# Patient Record
Sex: Female | Born: 1965 | Race: White | Hispanic: No | Marital: Married | Smoking: Never smoker
Health system: Southern US, Community
[De-identification: ages and names within clinical notes are randomized; demographics above are authoritative.]

## PROBLEM LIST (undated history)

## (undated) DIAGNOSIS — M069 Rheumatoid arthritis, unspecified: Secondary | ICD-10-CM

## (undated) DIAGNOSIS — E079 Disorder of thyroid, unspecified: Secondary | ICD-10-CM

## (undated) HISTORY — DX: Rheumatoid arthritis, unspecified: M06.9

## (undated) HISTORY — PX: VAGINAL PROLAPSE REPAIR: SHX830

## (undated) HISTORY — PX: FRACTURE SURGERY: SHX138

## (undated) HISTORY — PX: ABDOMINAL HYSTERECTOMY: SHX81

---

## 1996-10-27 HISTORY — PX: REDUCTION MAMMAPLASTY: SUR839

## 1998-05-29 ENCOUNTER — Inpatient Hospital Stay (HOSPITAL_COMMUNITY): Admission: AD | Admit: 1998-05-29 | Discharge: 1998-06-06 | Payer: Self-pay | Admitting: *Deleted

## 2004-12-05 ENCOUNTER — Ambulatory Visit: Payer: Self-pay | Admitting: Unknown Physician Specialty

## 2007-09-09 ENCOUNTER — Ambulatory Visit: Payer: Self-pay | Admitting: Internal Medicine

## 2007-09-16 ENCOUNTER — Ambulatory Visit: Payer: Self-pay | Admitting: Internal Medicine

## 2007-09-30 ENCOUNTER — Ambulatory Visit: Payer: Self-pay | Admitting: Internal Medicine

## 2008-01-25 ENCOUNTER — Ambulatory Visit: Payer: Self-pay | Admitting: Family Medicine

## 2008-05-15 ENCOUNTER — Ambulatory Visit: Payer: Self-pay | Admitting: Internal Medicine

## 2009-02-13 ENCOUNTER — Ambulatory Visit: Payer: Self-pay | Admitting: Unknown Physician Specialty

## 2009-03-31 IMAGING — CR CERVICAL SPINE - COMPLETE 4+ VIEW
1 series · 6 of 6 positions shown · non-contrast
Comparison: none

REASON FOR EXAM: neck pain
COMMENTS:

[Series 1: view not recorded · 0.17mm/px · 6 of 6 slices shown]
[im 1/6]
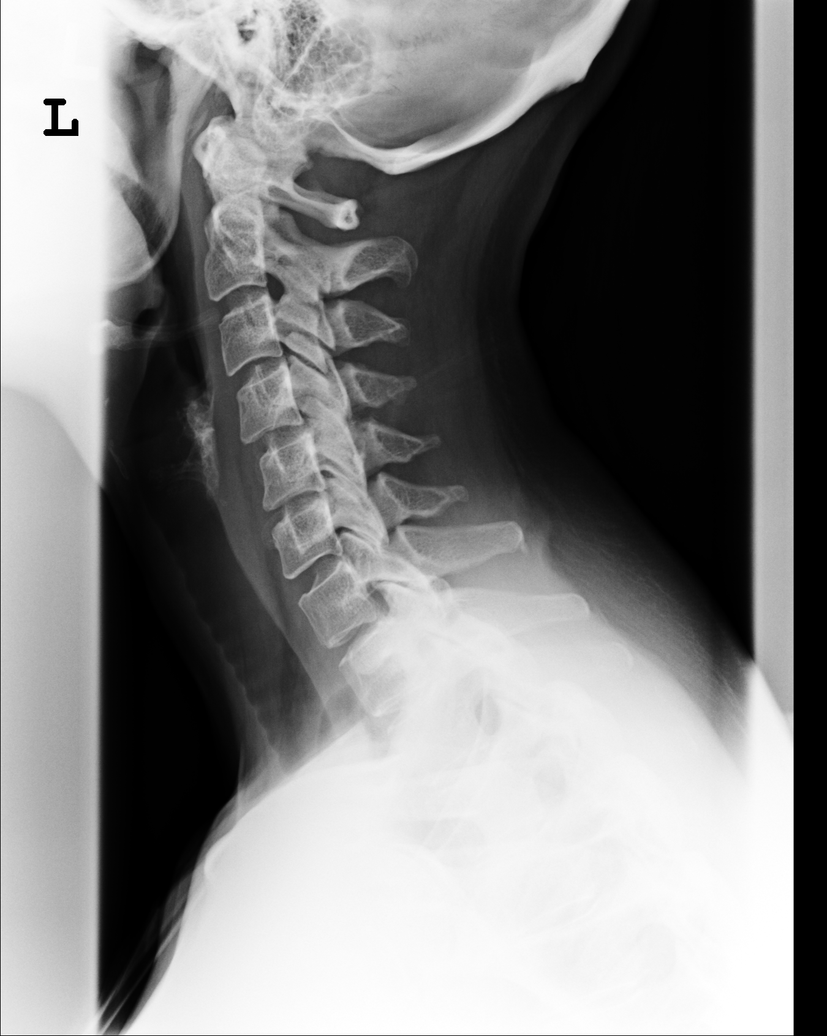
[im 2/6]
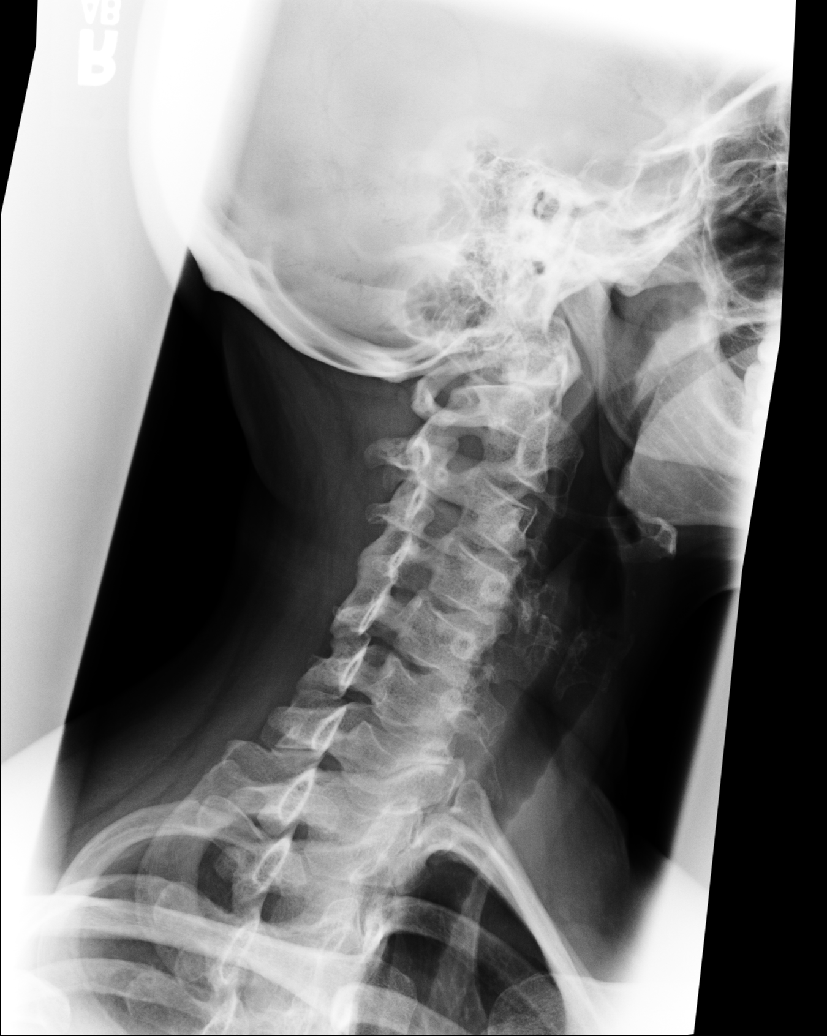
[im 3/6]
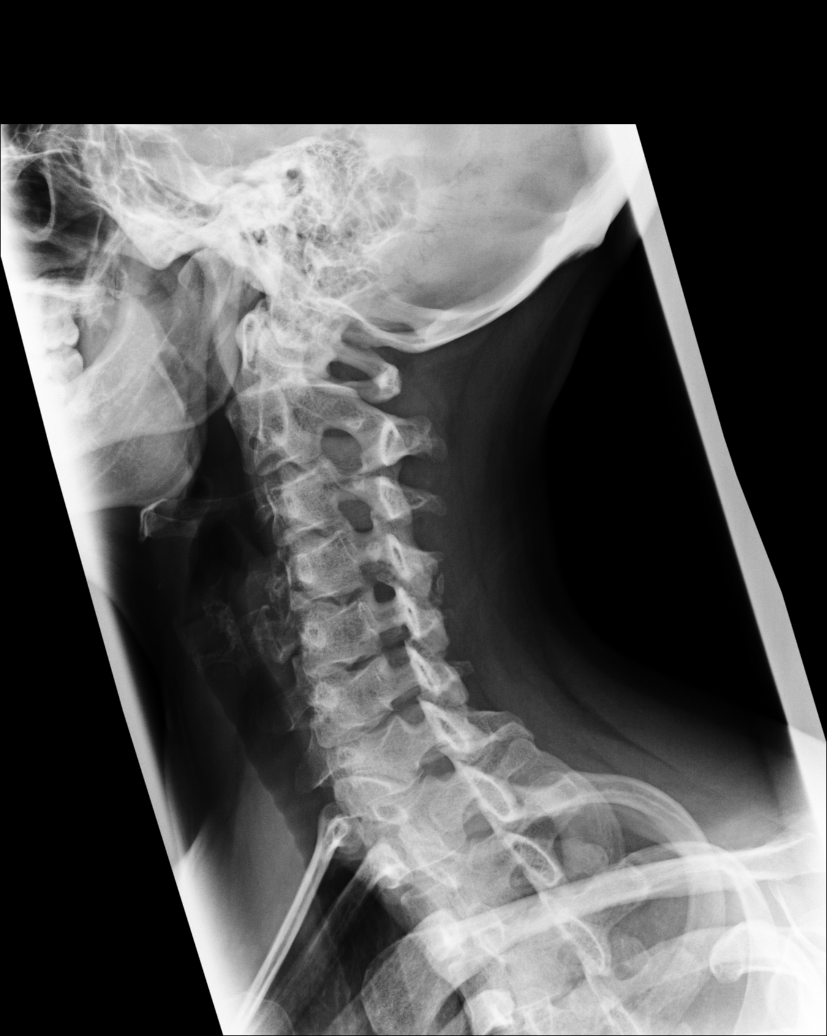
[im 4/6]
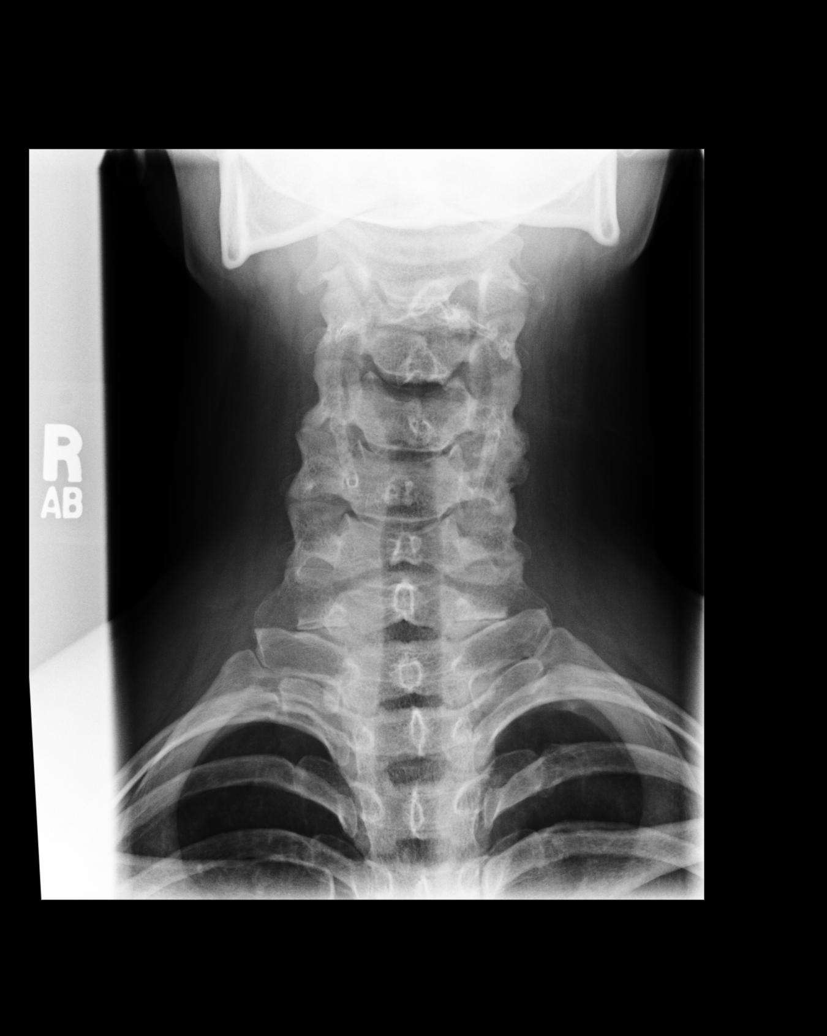
[im 5/6]
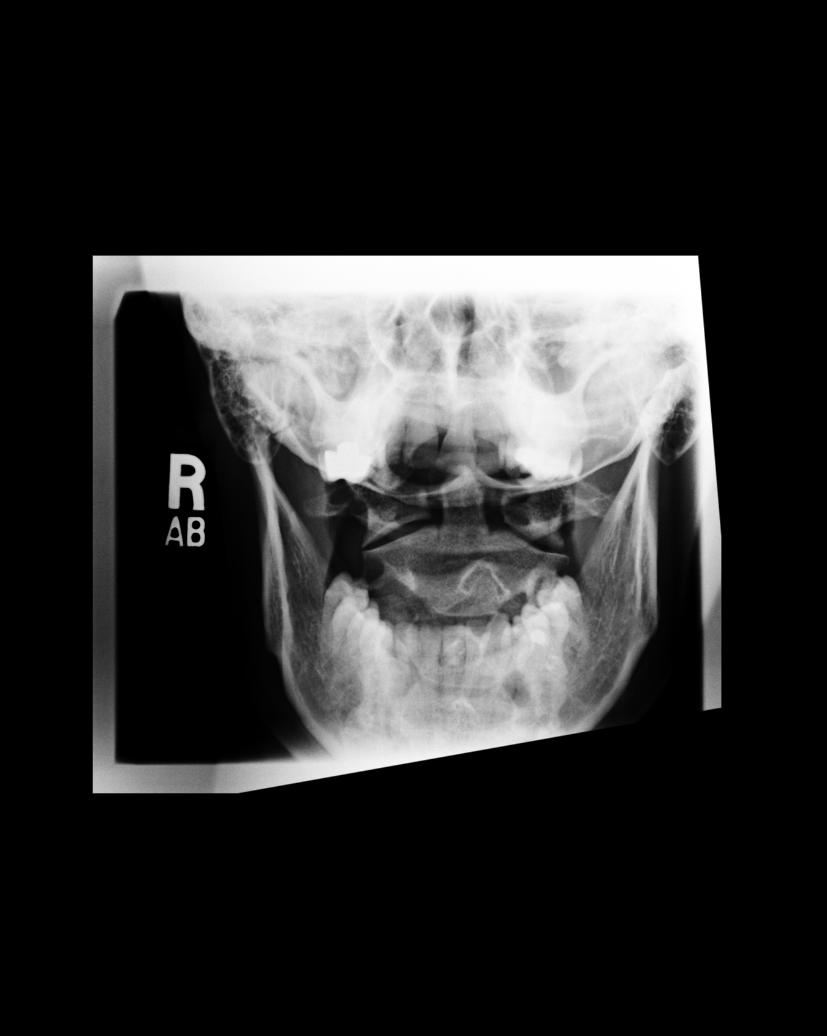
[im 6/6]
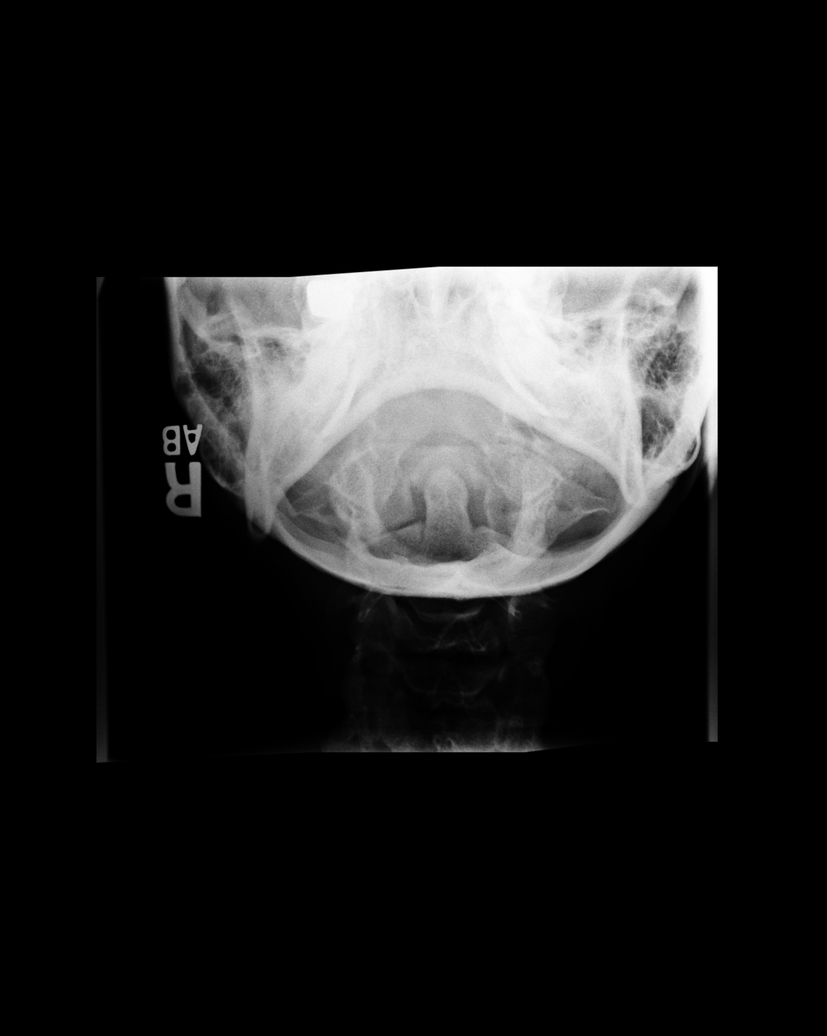

[6 of 6 positions shown; findings below may reference images not displayed]

PROCEDURE:     MDR - MDR CERVICAL SPINE COMPLETE  - September 09, 2007  [DATE]

RESULT:     Images of the cervical spine demonstrate the prevertebral soft
tissues appear to be normal. The spinal alignment is maintained. The facets
appear to be normally aligned. There is no significant bony encroachment on
the foramina. The odontoid is intact. The lateral masses of C1 and C2 align
normally.
IMPRESSION: Normal appearing cervical spine series. Should the patient have persistent
symptoms then MRI may be beneficial.

## 2009-04-13 ENCOUNTER — Ambulatory Visit: Payer: Self-pay | Admitting: Internal Medicine

## 2009-11-25 ENCOUNTER — Ambulatory Visit: Payer: Self-pay | Admitting: Internal Medicine

## 2010-06-18 ENCOUNTER — Ambulatory Visit: Payer: Self-pay | Admitting: Family Medicine

## 2010-06-20 ENCOUNTER — Ambulatory Visit: Payer: Self-pay | Admitting: Neurology

## 2010-08-02 ENCOUNTER — Ambulatory Visit: Payer: Self-pay | Admitting: Internal Medicine

## 2010-08-09 ENCOUNTER — Ambulatory Visit: Payer: Self-pay | Admitting: Internal Medicine

## 2010-11-19 ENCOUNTER — Ambulatory Visit: Payer: Self-pay

## 2011-03-19 ENCOUNTER — Ambulatory Visit: Payer: Self-pay | Admitting: Gastroenterology

## 2011-05-06 ENCOUNTER — Ambulatory Visit: Payer: Self-pay | Admitting: Orthopedic Surgery

## 2011-05-27 ENCOUNTER — Encounter: Payer: Self-pay | Admitting: Orthopedic Surgery

## 2011-05-28 ENCOUNTER — Encounter: Payer: Self-pay | Admitting: Orthopedic Surgery

## 2011-06-28 ENCOUNTER — Encounter: Payer: Self-pay | Admitting: Orthopedic Surgery

## 2012-03-23 ENCOUNTER — Ambulatory Visit: Payer: Self-pay | Admitting: Family Medicine

## 2012-03-30 ENCOUNTER — Ambulatory Visit: Payer: Self-pay | Admitting: Internal Medicine

## 2012-06-21 ENCOUNTER — Ambulatory Visit: Payer: Self-pay | Admitting: Internal Medicine

## 2012-06-22 ENCOUNTER — Ambulatory Visit: Payer: Self-pay | Admitting: Internal Medicine

## 2012-10-14 ENCOUNTER — Ambulatory Visit: Payer: Self-pay

## 2013-08-23 ENCOUNTER — Ambulatory Visit: Payer: Self-pay | Admitting: Family Medicine

## 2014-11-21 ENCOUNTER — Emergency Department: Payer: Self-pay | Admitting: Emergency Medicine

## 2014-11-21 LAB — URINALYSIS, COMPLETE
Bilirubin,UR: NEGATIVE
Glucose,UR: NEGATIVE mg/dL
Ketone: NEGATIVE
Nitrite: NEGATIVE
Ph: 5
Protein: 30
RBC,UR: 27 /HPF
Specific Gravity: 1.035
Squamous Epithelial: 100
WBC UR: 51 /HPF

## 2014-11-21 LAB — CBC WITH DIFFERENTIAL/PLATELET
Basophil #: 0 10*3/uL
Basophil %: 0.3 %
Eosinophil #: 0.1 10*3/uL
Eosinophil %: 0.7 %
HCT: 51.5 % — ABNORMAL HIGH
HGB: 16.9 g/dL — ABNORMAL HIGH
Lymphocyte %: 24.8 %
Lymphs Abs: 2.1 10*3/uL
MCH: 30.4 pg
MCHC: 32.9 g/dL
MCV: 93 fL
Monocyte #: 0.7 10*3/uL
Monocyte %: 8.4 %
Neutrophil #: 5.5 10*3/uL
Neutrophil %: 65.8 %
Platelet: 242 10*3/uL
RBC: 5.57 X10 6/mm 3 — ABNORMAL HIGH
RDW: 13.3 %
WBC: 8.4 10*3/uL

## 2015-01-30 ENCOUNTER — Ambulatory Visit
Admit: 2015-01-30 | Disposition: A | Discharge status: Home / Self Care | Payer: Self-pay | Attending: Internal Medicine | Admitting: Internal Medicine

## 2015-02-01 ENCOUNTER — Ambulatory Visit
Admit: 2015-02-01 | Disposition: A | Discharge status: Home / Self Care | Payer: Self-pay | Attending: Internal Medicine | Admitting: Internal Medicine

## 2015-02-07 ENCOUNTER — Ambulatory Visit
Admit: 2015-02-07 | Disposition: A | Discharge status: Home / Self Care | Payer: Self-pay | Attending: Internal Medicine | Admitting: Internal Medicine

## 2015-06-09 ENCOUNTER — Other Ambulatory Visit: Payer: Self-pay

## 2015-06-09 ENCOUNTER — Encounter: Payer: Self-pay | Admitting: Emergency Medicine

## 2015-06-09 ENCOUNTER — Emergency Department
Admission: EM | Admit: 2015-06-09 | Discharge: 2015-06-09 | Disposition: A | Discharge status: Home / Self Care | Payer: BC Managed Care – PPO | Attending: Emergency Medicine | Admitting: Emergency Medicine

## 2015-06-09 ENCOUNTER — Emergency Department: Payer: BC Managed Care – PPO

## 2015-06-09 DIAGNOSIS — R0789 Other chest pain: Secondary | ICD-10-CM | POA: Diagnosis not present

## 2015-06-09 DIAGNOSIS — R079 Chest pain, unspecified: Secondary | ICD-10-CM | POA: Diagnosis present

## 2015-06-09 HISTORY — DX: Disorder of thyroid, unspecified: E07.9

## 2015-06-09 LAB — CBC
HCT: 43.7 % (ref 35.0–47.0)
HEMOGLOBIN: 15 g/dL (ref 12.0–16.0)
MCH: 31 pg (ref 26.0–34.0)
MCHC: 34.3 g/dL (ref 32.0–36.0)
MCV: 90.3 fL (ref 80.0–100.0)
PLATELETS: 273 10*3/uL (ref 150–440)
RBC: 4.84 MIL/uL (ref 3.80–5.20)
RDW: 12.6 % (ref 11.5–14.5)
WBC: 10.5 10*3/uL (ref 3.6–11.0)

## 2015-06-09 LAB — BASIC METABOLIC PANEL
ANION GAP: 10 (ref 5–15)
BUN: 14 mg/dL (ref 6–20)
CALCIUM: 9.3 mg/dL (ref 8.9–10.3)
CO2: 25 mmol/L (ref 22–32)
Chloride: 98 mmol/L — ABNORMAL LOW (ref 101–111)
Creatinine, Ser: 0.95 mg/dL (ref 0.44–1.00)
GFR calc Af Amer: >60 mL/min (ref >60)
GFR calc non Af Amer: >60 mL/min (ref >60)
Glucose, Bld: 145 mg/dL — ABNORMAL HIGH (ref 65–99)
Potassium: 3.7 mmol/L (ref 3.5–5.1)
Sodium: 133 mmol/L — ABNORMAL LOW (ref 135–145)

## 2015-06-09 LAB — TROPONIN I: Troponin I: <0.03 ng/mL (ref <0.031)

## 2015-06-09 MED ORDER — ASPIRIN 81 MG PO CHEW
324.0000 mg | CHEWABLE_TABLET | Freq: Once | ORAL | Status: AC
  Administered 2015-06-09: 324 mg via ORAL
  Filled 2015-06-09: qty 4

## 2015-06-09 MED ORDER — ACETAMINOPHEN 500 MG PO TABS
1000.0000 mg | ORAL_TABLET | ORAL | Status: AC
  Administered 2015-06-09: 1000 mg via ORAL
  Filled 2015-06-09: qty 2

## 2015-06-09 NOTE — Discharge Instructions (Signed)
Chest Pain Observation  You have been seen in the Emergency Department (ED) today for chest pain.  As we have discussed todays test results are normal, but you may require further testing.  Please follow up with Dr. Lady Gary on this Monday as instructed above in these documents regarding todays emergent visit and your recent symptoms to discuss further management.  Continue to take your regular medications. If you are not doing so already, please also take a daily baby aspirin (81 mg), at least until you follow up with your doctor.  Return to the Emergency Department (ED) if you experience any further chest pain/pressure/tightness, difficulty breathing, or sudden sweating, or other symptoms that concern you.   It is often hard to give a specific diagnosis for the cause of chest pain. Among other possibilities your symptoms might be caused by inadequate oxygen delivery to your heart (angina). Angina that is not treated or evaluated can lead to a heart attack (myocardial infarction) or death. Blood tests, electrocardiograms, and X-rays may have been done to help determine a possible cause of your chest pain. After evaluation and observation, your health care provider has determined that it is unlikely your pain was caused by an unstable condition that requires hospitalization. However, a full evaluation of your pain may need to be completed, with additional diagnostic testing as directed. It is very important to keep your follow-up appointments. Not keeping your follow-up appointments could result in permanent heart damage, disability, or death. If there is any problem keeping your follow-up appointments, you must call your health care provider. HOME CARE INSTRUCTIONS  Due to the slight chance that your pain could be angina, it is important to follow your health care provider's treatment plan and also maintain a healthy lifestyle:  Maintain or work toward achieving a healthy weight.  Stay physically  active and exercise regularly.  Decrease your salt intake.  Eat a balanced, healthy diet. Talk to a dietitian to learn about heart-healthy foods.  Increase your fiber intake by including whole grains, vegetables, fruits, and nuts in your diet.  Avoid situations that cause stress, anger, or depression.  Take medicines as advised by your health care provider. Report any side effects to your health care provider. Do not stop medicines or adjust the dosages on your own.  Quit smoking. Do not use nicotine patches or gum until you check with your health care provider.  Keep your blood pressure, blood sugar, and cholesterol levels within normal limits.  Limit alcohol intake to no more than 1 drink per day for women who are not pregnant and 2 drinks per day for men.  Do not abuse drugs. SEEK IMMEDIATE MEDICAL CARE IF: You have severe chest pain or pressure which may include symptoms such as:  You feel pain or pressure in your arms, neck, jaw, or back.  You have severe back or abdominal pain, feel sick to your stomach (nauseous), or throw up (vomit).  You are sweating profusely.  You are having a fast or irregular heartbeat.  You feel short of breath while at rest.  You notice increasing shortness of breath during rest, sleep, or with activity.  You have chest pain that does not get better after rest or after taking your usual medicine.  You wake from sleep with chest pain.  You are unable to sleep because you cannot breathe.  You develop a frequent cough or you are coughing up blood.  You feel dizzy, faint, or experience extreme fatigue.  You develop severe  weakness, dizziness, fainting, or chills. Any of these symptoms may represent a serious problem that is an emergency. Do not wait to see if the symptoms will go away. Call your local emergency services (911 in the [place] Do not drive yourself to the hospital. MAKE SURE YOU:  Understand these instructions.  Will watch your  condition.  Will get help right away if you are not doing well or get worse. Document Released: 11/15/2010 Document Revised: 10/18/2013 Document Reviewed: 04/14/2013 [facility] Patient Information 2015 [place], [place]. This information is not intended to replace advice given to you by your health care provider. Make sure you discuss any questions you have with your health care provider.

## 2015-06-09 NOTE — ED Provider Notes (Signed)
[facility] Emergency Department Provider Note  ____________________________________________  Time seen: Approximately 5:42 PM  I have reviewed the triage vital signs and the nursing notes.   HISTORY  Chief Complaint Chest Pain    HPI Cheryl Clark is a 49 y.o. female the history of hyperthyroidism who reports that she had an episode of severe chest tightness that lasted about 30 minutes starting around 1:30 PM. She reports that this is improved now. She had tight this across her chest and it felt like it radiated up towards her neck. It went away on its own and occurred while she was driving the car. She denies any trouble breathing now, but did feel a little short of breath at that exact time and felt that she is having a "panic attack" due to being under heavy stress taking care of her mother and father who are currently very ill. She does report a history of being under stress and pressure in the past, but has never had an episode where she explains chest pressure.  Present time she feels well but has a very mild headache and feels like a stress type headache.  No abdominal pain no nausea or vomiting. Not pregnant.  No recent surgeries, no leg swelling except when she stands for long periods but none now, no swelling in just one leg, denies any fevers or chills, no history of blood clots, not any estrogens, no long trips or travel.  Past Medical History  Diagnosis Date  . Thyroid disease     There are no active problems to display for this patient.   Past Surgical History  Procedure Laterality Date  . Abdominal hysterectomy    . Fracture surgery      Left femur rod  . Vaginal prolapse repair      No current outpatient prescriptions on file.  Allergies Codeine; Demerol; Macrobid; and Sulfa antibiotics  No family history on file.  Social History Social History  Substance Use Topics  . Smoking status: Never Smoker   .  Smokeless tobacco: None  . Alcohol Use: No    Review of Systems Constitutional: No fever/chills Eyes: No visual changes. ENT: No sore throat. Cardiovascular: See history of present illness  Respiratory: See history of present illness  Gastrointestinal: No abdominal pain.  No nausea, no vomiting.  No diarrhea.  No constipation. Genitourinary: Negative for dysuria. Musculoskeletal: Negative for back pain. Skin: Negative for rash. Neurological: Negative for focal weakness or numbness.  10-point ROS otherwise negative.  ____________________________________________   PHYSICAL EXAM:  VITAL SIGNS: ED Triage Vitals  Enc Vitals Group     BP 06/09/15 1435 118/81 mmHg     Pulse Rate 06/09/15 1435 86     Resp 06/09/15 1435 18     Temp 06/09/15 1435 97.7 F (36.5 C)     Temp src --      SpO2 06/09/15 1435 98 %     Weight 06/09/15 1431 250 lb (113.399 kg)     Height 06/09/15 1431 5\' 11"  (1.803 m)     Head Cir --      Peak Flow --      Pain Score 06/09/15 1432 8     Pain Loc --      Pain Edu? --      Excl. in GC? --     Constitutional: Alert and oriented. Well appearing and in no acute distress. Eyes: Conjunctivae are normal. PERRL. EOMI. Head: Atraumatic. Nose: No congestion/rhinnorhea. Mouth/Throat: Mucous membranes  are moist.  Oropharynx non-erythematous. Neck: No stridor.   Cardiovascular: Normal rate, regular rhythm. Grossly normal heart sounds.  Good peripheral circulation. Respiratory: Normal respiratory effort.  No retractions. Lungs CTAB. Gastrointestinal: Soft and nontender. No distention. No abdominal bruits. No CVA tenderness. Musculoskeletal: No lower extremity tenderness nor edema.  No thigh tenderness. Neurologic:  Normal speech and language. No gross focal neurologic deficits are appreciated. No gross focal neurologic deficits are appreciated. Skin:  Skin is warm, dry and intact. No rash noted. Psychiatric: Mood and affect are normal. Speech and behavior are  normal.  ____________________________________________   LABS (all labs ordered are listed, but only abnormal results are displayed)  Labs Reviewed  BASIC METABOLIC PANEL - Abnormal; Notable for the following:    Sodium 133 (*)    Chloride 98 (*)    Glucose, Bld 145 (*)    All other components within normal limits  CBC  TROPONIN I  TROPONIN I   ____________________________________________  EKG  ED ECG REPORT I, Cherisa Brucker, the attending physician, personally viewed and interpreted this ECG.  Date: 06/09/2015 EKG Time: 1430 Rate: 90 Rhythm: normal sinus rhythm QRS Axis: normal Intervals: normal ST/T Wave abnormalities: normal except for a very minimal T-wave inversion in aVF with a normal-appearing lead II Conduction Disutrbances: none Narrative Interpretation: unremarkable  Date: 06/09/2015 EKG Time: 1815 Rate: 75 Rhythm: normal sinus rhythm QRS Axis: normal Intervals: normal ST/T Wave abnormalities: normal except for a very minimal T-wave inversion in aVF with a normal-appearing lead II. No change vs prior ECG. Conduction Disutrbances: none Narrative Interpretation: unremarkable  Both EKGs discussed with Dr. Lady Gary.  ____________________________________________  RADIOLOGY  DG Chest 2 View (Final result) Result time: 06/09/15 15:13:55   Final result by Rad Results In Interface (06/09/15 15:13:55)   Narrative:   CLINICAL DATA: Chest pain and left jaw tightness  EXAM: CHEST - 2 VIEW  COMPARISON: None.  FINDINGS: The heart size and mediastinal contours are within normal limits. Both lungs are clear. The visualized skeletal structures are unremarkable.  IMPRESSION: No active disease.    ____________________________________________   PROCEDURES  Procedure(s) performed: None  Critical Care performed: No  ____________________________________________   INITIAL IMPRESSION / ASSESSMENT AND PLAN / ED COURSE  Pertinent labs & imaging results  that were available during my care of the patient were reviewed by me and considered in my medical decision making (see chart for details).  Patient presents with chest pain. It is reassuring that her pain and symptoms are gone now except for mild headache, but her symptoms are concerning having had chest pressure and some brief dyspnea though this is in the setting of being under tremendous recent stress. Her EKG is very reassuring and her first troponin is normal. She is awake and alert in no distress at this time and has no cardiac risk factors herself except for a mother who had coronary disease in her mid 67s. She has no risk factors for pulmonary embolism. Her chest x-ray is clear. Her vital signs are stable. She is in no distress during the time of my evaluation.  Based on the patient's symptomatology and EKGs with negative troponins her heart score is determined to be low risk for major cardiac event.  I discussed with the patient and her husband her evaluation and her laboratory analysis. I also discussed with Dr. Lady Gary the patient EKGs clinical history and labs reviewed. We will plan to send a second troponin, and if this is normal we'll discharge her with follow-up  planned on Monday with cardiology. She is very agreeable with this plan and seems very competent to follow through. I discuss careful return precautions with her and her husband, both of whom are very agreeable with this plan of care. She'll return to the emergency room right away should she develop any recurrence of her chest pain and will continue to daily aspirin until followed up by cardiology. ____________________________________________   FINAL CLINICAL IMPRESSION(S) / ED DIAGNOSES  Final diagnoses:  Chest pain with low risk for cardiac etiology      Sharyn Creamer, MD 06/09/15 [id number]

## 2015-06-09 NOTE — ED Notes (Addendum)
Patient states she woke up with tightness to left jaw this am, developed severe chest pain approx 30 minutes ago. Patient denies cardiac history. +Shortness of breath. Patient denies history of panic attack, but states she has been under increased stress this week.

## 2016-05-13 DIAGNOSIS — M2550 Pain in unspecified joint: Secondary | ICD-10-CM | POA: Insufficient documentation

## 2016-08-04 ENCOUNTER — Other Ambulatory Visit: Payer: Self-pay | Admitting: Family Medicine

## 2016-08-04 DIAGNOSIS — Z1239 Encounter for other screening for malignant neoplasm of breast: Secondary | ICD-10-CM

## 2016-08-08 DIAGNOSIS — M797 Fibromyalgia: Secondary | ICD-10-CM | POA: Insufficient documentation

## 2016-08-11 ENCOUNTER — Other Ambulatory Visit: Payer: Self-pay | Admitting: Family Medicine

## 2016-08-11 DIAGNOSIS — Z1239 Encounter for other screening for malignant neoplasm of breast: Secondary | ICD-10-CM

## 2016-08-26 ENCOUNTER — Ambulatory Visit
Admission: RE | Admit: 2016-08-26 | Discharge: 2016-08-26 | Disposition: A | Discharge status: Home / Self Care | Payer: 59 | Source: Ambulatory Visit | Attending: Family Medicine | Admitting: Family Medicine

## 2016-08-26 DIAGNOSIS — Z1239 Encounter for other screening for malignant neoplasm of breast: Secondary | ICD-10-CM

## 2016-08-26 DIAGNOSIS — R928 Other abnormal and inconclusive findings on diagnostic imaging of breast: Secondary | ICD-10-CM | POA: Diagnosis not present

## 2016-09-17 ENCOUNTER — Other Ambulatory Visit: Payer: BLUE CROSS/BLUE SHIELD

## 2016-09-17 ENCOUNTER — Ambulatory Visit: Payer: BLUE CROSS/BLUE SHIELD

## 2017-06-10 DIAGNOSIS — M79604 Pain in right leg: Secondary | ICD-10-CM | POA: Insufficient documentation

## 2017-07-27 ENCOUNTER — Other Ambulatory Visit: Payer: Self-pay | Admitting: Family Medicine

## 2017-07-27 DIAGNOSIS — Z1231 Encounter for screening mammogram for malignant neoplasm of breast: Secondary | ICD-10-CM

## 2017-08-28 ENCOUNTER — Ambulatory Visit
Admission: RE | Admit: 2017-08-28 | Discharge: 2017-08-28 | Disposition: A | Discharge status: Home / Self Care | Payer: 59 | Source: Ambulatory Visit | Attending: Family Medicine | Admitting: Family Medicine

## 2017-08-28 DIAGNOSIS — Z1231 Encounter for screening mammogram for malignant neoplasm of breast: Secondary | ICD-10-CM | POA: Diagnosis not present

## 2017-08-31 DIAGNOSIS — E8881 Metabolic syndrome: Secondary | ICD-10-CM | POA: Insufficient documentation

## 2017-12-15 DIAGNOSIS — M545 Low back pain, unspecified: Secondary | ICD-10-CM | POA: Insufficient documentation

## 2017-12-15 DIAGNOSIS — M51369 Other intervertebral disc degeneration, lumbar region without mention of lumbar back pain or lower extremity pain: Secondary | ICD-10-CM | POA: Insufficient documentation

## 2017-12-28 ENCOUNTER — Other Ambulatory Visit: Payer: Self-pay | Admitting: Specialist

## 2017-12-28 DIAGNOSIS — M5416 Radiculopathy, lumbar region: Secondary | ICD-10-CM

## 2017-12-29 DIAGNOSIS — M542 Cervicalgia: Secondary | ICD-10-CM | POA: Insufficient documentation

## 2018-03-08 DIAGNOSIS — M48061 Spinal stenosis, lumbar region without neurogenic claudication: Secondary | ICD-10-CM | POA: Insufficient documentation

## 2018-03-08 DIAGNOSIS — M4807 Spinal stenosis, lumbosacral region: Secondary | ICD-10-CM | POA: Insufficient documentation

## 2018-06-23 ENCOUNTER — Other Ambulatory Visit: Payer: Self-pay | Admitting: Family Medicine

## 2018-06-23 DIAGNOSIS — Z1231 Encounter for screening mammogram for malignant neoplasm of breast: Secondary | ICD-10-CM

## 2018-06-24 DIAGNOSIS — Z13820 Encounter for screening for osteoporosis: Secondary | ICD-10-CM | POA: Insufficient documentation

## 2018-06-24 DIAGNOSIS — R208 Other disturbances of skin sensation: Secondary | ICD-10-CM | POA: Insufficient documentation

## 2018-06-24 DIAGNOSIS — R7681 Abnormal rheumatoid factor and anti-citrullinated protein antibody without rheumatoid arthritis: Secondary | ICD-10-CM | POA: Insufficient documentation

## 2018-08-02 ENCOUNTER — Other Ambulatory Visit: Payer: Self-pay | Admitting: Family Medicine

## 2018-08-02 DIAGNOSIS — M79605 Pain in left leg: Secondary | ICD-10-CM

## 2018-08-02 DIAGNOSIS — M7989 Other specified soft tissue disorders: Secondary | ICD-10-CM

## 2018-08-03 ENCOUNTER — Encounter (INDEPENDENT_AMBULATORY_CARE_PROVIDER_SITE_OTHER): Payer: Self-pay

## 2018-08-03 ENCOUNTER — Ambulatory Visit
Admission: RE | Admit: 2018-08-03 | Discharge: 2018-08-03 | Disposition: A | Discharge status: Home / Self Care | Payer: 59 | Source: Ambulatory Visit | Attending: Family Medicine | Admitting: Family Medicine

## 2018-08-03 DIAGNOSIS — M79605 Pain in left leg: Secondary | ICD-10-CM | POA: Insufficient documentation

## 2018-08-03 DIAGNOSIS — M7989 Other specified soft tissue disorders: Secondary | ICD-10-CM | POA: Diagnosis not present

## 2018-09-01 ENCOUNTER — Ambulatory Visit
Admission: RE | Admit: 2018-09-01 | Discharge: 2018-09-01 | Disposition: A | Discharge status: Home / Self Care | Payer: 59 | Source: Ambulatory Visit | Attending: Family Medicine | Admitting: Family Medicine

## 2018-09-01 DIAGNOSIS — Z1231 Encounter for screening mammogram for malignant neoplasm of breast: Secondary | ICD-10-CM | POA: Insufficient documentation

## 2018-09-14 ENCOUNTER — Other Ambulatory Visit: Payer: Self-pay | Admitting: Neurology

## 2018-09-14 ENCOUNTER — Other Ambulatory Visit: Payer: Self-pay | Admitting: Orthopedic Surgery

## 2018-09-14 DIAGNOSIS — R52 Pain, unspecified: Secondary | ICD-10-CM

## 2018-10-05 ENCOUNTER — Ambulatory Visit
Admission: RE | Admit: 2018-10-05 | Discharge: 2018-10-05 | Disposition: A | Discharge status: Home / Self Care | Payer: Commercial Managed Care - HMO | Source: Ambulatory Visit | Attending: Neurology | Admitting: Neurology

## 2018-10-05 DIAGNOSIS — M79672 Pain in left foot: Secondary | ICD-10-CM | POA: Diagnosis not present

## 2018-10-05 DIAGNOSIS — M79604 Pain in right leg: Secondary | ICD-10-CM | POA: Insufficient documentation

## 2018-10-05 DIAGNOSIS — M79641 Pain in right hand: Secondary | ICD-10-CM | POA: Diagnosis present

## 2018-10-05 DIAGNOSIS — M79671 Pain in right foot: Secondary | ICD-10-CM | POA: Insufficient documentation

## 2018-10-05 DIAGNOSIS — M79642 Pain in left hand: Secondary | ICD-10-CM | POA: Diagnosis present

## 2018-10-05 DIAGNOSIS — M79605 Pain in left leg: Secondary | ICD-10-CM | POA: Diagnosis not present

## 2018-10-05 DIAGNOSIS — R52 Pain, unspecified: Secondary | ICD-10-CM

## 2018-10-05 DIAGNOSIS — M542 Cervicalgia: Secondary | ICD-10-CM | POA: Diagnosis not present

## 2018-10-05 MED ORDER — GADOBUTROL 1 MMOL/ML IV SOLN
10.0000 mL | Freq: Once | INTRAVENOUS | Status: AC | PRN
  Administered 2018-10-05: 10 mL via INTRAVENOUS

## 2018-12-23 ENCOUNTER — Other Ambulatory Visit: Payer: Self-pay | Admitting: Orthopedic Surgery

## 2018-12-23 DIAGNOSIS — M25361 Other instability, right knee: Secondary | ICD-10-CM

## 2018-12-23 DIAGNOSIS — M25461 Effusion, right knee: Secondary | ICD-10-CM

## 2018-12-23 DIAGNOSIS — M1711 Unilateral primary osteoarthritis, right knee: Secondary | ICD-10-CM

## 2018-12-30 ENCOUNTER — Ambulatory Visit: Admission: RE | Admit: 2018-12-30 | Payer: 59 | Source: Ambulatory Visit

## 2019-02-28 ENCOUNTER — Ambulatory Visit (HOSPITAL_COMMUNITY)
Admission: RE | Admit: 2019-02-28 | Discharge: 2019-02-28 | Disposition: A | Discharge status: Home / Self Care | Payer: 59 | Source: Ambulatory Visit | Attending: Orthopedic Surgery | Admitting: Orthopedic Surgery

## 2019-02-28 ENCOUNTER — Other Ambulatory Visit: Payer: Self-pay

## 2019-02-28 DIAGNOSIS — M1711 Unilateral primary osteoarthritis, right knee: Secondary | ICD-10-CM | POA: Diagnosis present

## 2019-02-28 DIAGNOSIS — M25361 Other instability, right knee: Secondary | ICD-10-CM | POA: Diagnosis not present

## 2019-02-28 DIAGNOSIS — M25461 Effusion, right knee: Secondary | ICD-10-CM | POA: Diagnosis present

## 2019-08-09 ENCOUNTER — Other Ambulatory Visit: Payer: Self-pay | Admitting: Family Medicine

## 2019-08-09 DIAGNOSIS — Z1231 Encounter for screening mammogram for malignant neoplasm of breast: Secondary | ICD-10-CM

## 2019-08-10 ENCOUNTER — Other Ambulatory Visit: Payer: Self-pay | Admitting: *Deleted

## 2019-08-10 DIAGNOSIS — Z20822 Contact with and (suspected) exposure to covid-19: Secondary | ICD-10-CM

## 2019-08-12 LAB — NOVEL CORONAVIRUS, NAA: SARS-CoV-2, NAA: NOT DETECTED

## 2019-08-19 ENCOUNTER — Other Ambulatory Visit: Payer: Self-pay | Admitting: Family Medicine

## 2019-08-19 DIAGNOSIS — Z1231 Encounter for screening mammogram for malignant neoplasm of breast: Secondary | ICD-10-CM

## 2019-08-19 DIAGNOSIS — N644 Mastodynia: Secondary | ICD-10-CM

## 2019-08-26 ENCOUNTER — Ambulatory Visit
Admission: RE | Admit: 2019-08-26 | Discharge: 2019-08-26 | Disposition: A | Discharge status: Home / Self Care | Payer: 59 | Source: Ambulatory Visit | Attending: Family Medicine | Admitting: Family Medicine

## 2019-08-26 DIAGNOSIS — Z1231 Encounter for screening mammogram for malignant neoplasm of breast: Secondary | ICD-10-CM

## 2019-08-26 DIAGNOSIS — N644 Mastodynia: Secondary | ICD-10-CM | POA: Diagnosis not present

## 2019-08-29 ENCOUNTER — Other Ambulatory Visit: Payer: Self-pay | Admitting: *Deleted

## 2019-08-29 DIAGNOSIS — Z20822 Contact with and (suspected) exposure to covid-19: Secondary | ICD-10-CM

## 2019-08-30 LAB — NOVEL CORONAVIRUS, NAA: SARS-CoV-2, NAA: NOT DETECTED

## 2019-09-08 ENCOUNTER — Ambulatory Visit: Payer: 59

## 2019-11-03 ENCOUNTER — Ambulatory Visit: Payer: 59 | Attending: Internal Medicine

## 2019-11-03 DIAGNOSIS — Z20822 Contact with and (suspected) exposure to covid-19: Secondary | ICD-10-CM

## 2019-11-05 LAB — NOVEL CORONAVIRUS, NAA: SARS-CoV-2, NAA: NOT DETECTED

## 2019-11-08 ENCOUNTER — Ambulatory Visit: Payer: 59 | Attending: Internal Medicine

## 2019-11-08 DIAGNOSIS — Z20822 Contact with and (suspected) exposure to covid-19: Secondary | ICD-10-CM

## 2019-11-09 ENCOUNTER — Telehealth: Payer: Self-pay | Admitting: *Deleted

## 2019-11-09 LAB — NOVEL CORONAVIRUS, NAA: SARS-CoV-2, NAA: NOT DETECTED

## 2019-11-09 NOTE — Telephone Encounter (Signed)
Called for COVID results, no results are resulted yet. Pt educated that we will call as results come in, verbalized understanding.

## 2019-11-15 ENCOUNTER — Other Ambulatory Visit: Payer: 59

## 2019-12-20 DIAGNOSIS — I471 Supraventricular tachycardia, unspecified: Secondary | ICD-10-CM | POA: Insufficient documentation

## 2020-02-07 DIAGNOSIS — Z1159 Encounter for screening for other viral diseases: Secondary | ICD-10-CM | POA: Insufficient documentation

## 2020-02-07 DIAGNOSIS — E559 Vitamin D deficiency, unspecified: Secondary | ICD-10-CM | POA: Insufficient documentation

## 2020-02-07 DIAGNOSIS — M1380 Other specified arthritis, unspecified site: Secondary | ICD-10-CM | POA: Insufficient documentation

## 2020-02-07 DIAGNOSIS — M1711 Unilateral primary osteoarthritis, right knee: Secondary | ICD-10-CM | POA: Insufficient documentation

## 2020-02-07 DIAGNOSIS — E538 Deficiency of other specified B group vitamins: Secondary | ICD-10-CM | POA: Insufficient documentation

## 2020-02-09 ENCOUNTER — Ambulatory Visit: Payer: 59 | Attending: Internal Medicine

## 2020-02-09 DIAGNOSIS — Z23 Encounter for immunization: Secondary | ICD-10-CM

## 2020-02-09 NOTE — Progress Notes (Signed)
   Covid-19 Vaccination Clinic  Name:  Cheryl Clark    MRN: [id number] DOB: September 16, 1966  02/09/2020  [name] was observed post Covid-19 immunization for 15 minutes without incident. She was provided with Vaccine Information Sheet and instruction to access the V-Safe system.   [name] was instructed to Clark 911 with any severe reactions post vaccine: Marland Kitchen Difficulty breathing  . Swelling of face and throat  . A fast heartbeat  . A bad rash all over body  . Dizziness and weakness   Immunizations Administered    Name Date Dose VIS Date Route   Pfizer COVID-19 Vaccine 02/09/2020 10:54 AM 0.3 mL 10/07/2019 Intramuscular   Manufacturer: [employer], [employer]   Lot: [id number]   NDC: [address]-7

## 2020-03-07 ENCOUNTER — Ambulatory Visit: Payer: 59 | Attending: Internal Medicine

## 2020-06-21 DIAGNOSIS — M0609 Rheumatoid arthritis without rheumatoid factor, multiple sites: Secondary | ICD-10-CM | POA: Insufficient documentation

## 2020-07-23 ENCOUNTER — Ambulatory Visit (INDEPENDENT_AMBULATORY_CARE_PROVIDER_SITE_OTHER): Payer: 59 | Admitting: Certified Nurse Midwife

## 2020-07-23 ENCOUNTER — Encounter: Payer: Self-pay | Admitting: Certified Nurse Midwife

## 2020-07-23 ENCOUNTER — Other Ambulatory Visit: Payer: Self-pay

## 2020-07-23 VITALS — BP 116/75 | HR 69 | Ht 69.0 in | Wt 270.5 lb

## 2020-07-23 DIAGNOSIS — R635 Abnormal weight gain: Secondary | ICD-10-CM

## 2020-07-23 DIAGNOSIS — R232 Flushing: Secondary | ICD-10-CM

## 2020-07-23 DIAGNOSIS — E538 Deficiency of other specified B group vitamins: Secondary | ICD-10-CM | POA: Diagnosis not present

## 2020-07-23 MED ORDER — ESTRADIOL-NORETHINDRONE ACET 1-0.5 MG PO TABS: 1.0000 | ORAL_TABLET | Freq: Every day | ORAL | 11 refills | Status: DC

## 2020-07-23 NOTE — Progress Notes (Addendum)
GYN ENCOUNTER NOTE  Subjective:       Cheryl Clark is a 54 y.o. [phone] female is here for gynecologic evaluation of the following issues:  1.Menopausal symptoms, pt state she has had hot flashes, mood changes, weight gain and inability to loses weight, vaginal dryness, and urinary urgency. 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 She also has a history of endometriosis that she had surgery for in 1992. Marland Kitchen Her main concern is weight gain and inability to lose weight.    Gynecologic History No LMP recorded. Patient has had a hysterectomy. Contraception: none hysterectomy 2005  Last Pap: unsure  Results were: normal per pt  Last mammogram: 07/2019. Results were: normal  Obstetric History OB History  Gravida Para Term Preterm AB Living  3 2 1 1 1 2   SAB TAB Ectopic Multiple Live Births  1       2    # Outcome Date GA Lbr Len/2nd Weight Sex Delivery Anes PTL Lv  3 Term 09/13/01   9 lb (4.082 kg) M Vag-Spont  N LIV  2 Preterm 06/18/98   5 lb 9 oz (2.523 kg) M Vag-Spont  Y LIV  1 SAB 10/1995            Past Medical History:  Diagnosis Date  . Rheumatoid arthritis (HCC)   . Thyroid disease     Past Surgical History:  Procedure Laterality Date  . ABDOMINAL HYSTERECTOMY    . FRACTURE SURGERY     Left femur rod  . REDUCTION MAMMAPLASTY Bilateral 1998  . VAGINAL PROLAPSE REPAIR      Current Outpatient Medications on File Prior to Visit  Medication Sig Dispense Refill  . ADDERALL XR 20 MG 24 hr capsule Take 20 mg by mouth every morning.    11/1995 ALPRAZolam (XANAX) 0.25 MG tablet Take 0.25 mg by mouth daily as needed.    . baclofen (LIORESAL) 10 MG tablet Take 10 mg by mouth daily.    . diclofenac Sodium (VOLTAREN) 1 % GEL Apply topically.    . furosemide (LASIX) 20 MG tablet Take 20 mg by mouth daily.    . hydroxychloroquine (PLAQUENIL) 200 MG tablet     . lisdexamfetamine  (VYVANSE) 40 MG capsule Take by mouth.    . Multiple Vitamin (MULTI-VITAMIN) tablet Take 1 tablet by mouth daily.    . Naproxen Sodium (ALEVE) 220 MG CAPS Aleve 220 mg capsule   2 capsules every day by oral route.    . gabapentin (NEURONTIN) 300 MG capsule Take 300 mg by mouth 3 (three) times daily.     No current facility-administered medications on file prior to visit.    Allergies  Allergen Reactions  . Codeine Itching  . Demerol [Meperidine] Itching  . Macrobid [Nitrofurantoin Marland Kitchen  . Sulfa Antibiotics Hives    Social History   Socioeconomic History  . Marital status: Married    Spouse name: Not on file  . Number of children: Not on file  . Years of education: Not on file  . Highest education level: Not on file  Occupational History  . Not on file  Tobacco Use  . Smoking status: Never Smoker  . Smokeless tobacco: Never Used  Substance and Sexual Activity  . Alcohol use: No  . Drug use: Never  . Sexual activity: Yes    Birth control/protection: Surgical  Other Topics Concern  .  Not on file  Social History Narrative  . Not on file   Social Determinants of Health   Financial Resource Strain:   . Difficulty of Paying Living Expenses: Not on file  Food Insecurity:   . Worried About Programme researcher, broadcasting/film/video in the Last Year: Not on file  . Ran Out of Food in the Last Year: Not on file  Transportation Needs:   . Lack of Transportation (Medical): Not on file  . Lack of Transportation (Non-Medical): Not on file  Physical Activity:   . Days of Exercise per Week: Not on file  . Minutes of Exercise per Session: Not on file  Stress:   . Feeling of Stress : Not on file  Social Connections:   . Frequency of Communication with Friends and Family: Not on file  . Frequency of Social Gatherings with Friends and Family: Not on file  . Attends Religious Services: Not on file  . Active Member of Clubs or Organizations: Not on file  . Attends Banker  Meetings: Not on file  . Marital Status: Not on file  Intimate Partner Violence:   . Fear of Current or Ex-Partner: Not on file  . Emotionally Abused: Not on file  . Physically Abused: Not on file  . Sexually Abused: Not on file    Family History  Problem Relation Age of Onset  . Breast cancer Paternal Grandmother     The following portions of the patient's history were reviewed and updated as appropriate: allergies, current medications, past family history, past medical history, past social history, past surgical history and problem list.  Review of Systems Review of Systems - Negative except except as mentioned in HPI Review of Systems - General ROS: negative for - chills, fatigue, fever, malaise or night sweats. Positive for hot flashes, mood changes, weight gain.  Hematological and Lymphatic ROS: negative for - bleeding problems or swollen lymph nodes Gastrointestinal ROS: negative for - abdominal pain, blood in stools, change in bowel habits and nausea/vomiting Musculoskeletal ROS: negative for - joint pain, muscle pain or muscular weakness Genito-Urinary ROS: negative for - change in menstrual cycle, dysmenorrhea, dyspareunia, dysuria, genital discharge, genital ulcers, hematuria, incontinence, irregular/heavy menses, nocturia or pelvic pain. Positive for vaginal dryness.   Objective:   BP 116/75   Pulse 69   Ht 5\' 9"  (1.753 m)   Wt 270 lb 8 oz (122.7 kg)   BMI 39.95 kg/m  CONSTITUTIONAL: Well-developed, well-nourished female in no acute distress.  HENT:  Normocephalic, atraumatic.  NECK: Normal range of motion, supple, no masses.  Normal thyroid.  SKIN: Skin is warm and dry. No rash noted. Not diaphoretic. No erythema. No pallor. NEUROLGIC: Alert and oriented to person, place, and time. PSYCHIATRIC: Normal mood and affect. Normal behavior. Normal judgment and thought content. CARDIOVASCULAR:Not Examined RESPIRATORY: Not Examined BREASTS: Not Examined ABDOMEN: Soft, non  distended; Non tender.  No Organomegaly. PELVIC:not indicated to discuss menopausal symptoms and treatment options.  MUSCULOSKELETAL: Normal range of motion. No tenderness.  No cyanosis, clubbing, or edema.   Assessment:   1. Hot flashes - FSH - Estradiol - Vitamin B12  2. Weight gain - FSH - Estradiol - Vitamin B12  3. Vitamin B12 deficiency - Vitamin B12     Plan:   Discussed self help measures including diet, exercise, soy, herbal supplements, self help measures. Discussed risks and benefits of HRT to treat symptoms. She denies any contraindications to use Deneis hx cardiovascular disease, any type of cancer,  liver disease, stroke, clotting disorders. She verbalizes understanding to risks and would like to try HRT. Discussed use of estrogen and progesterone replacement therapy to address menopausaol symptoms. Discussed diet and exercise for weight loss. She requesting refill on Vitamin B, will follow up once labs reviewed. Labs collected  today: FSH/estrodil and Vitamin B 12. She agrees. Orders placed. Follow up as soon as able for annual GYN exam.    Face to face time 17 min to review history, discussed current concerns, answer questions, develop plan.   Doreene Burke, CNM

## 2020-07-23 NOTE — Patient Instructions (Signed)
Menopause and Hormone Replacement Therapy Menopause is a normal time of life when menstrual periods stop completely and the ovaries stop producing the female hormones estrogen and progesterone. This lack of hormones can affect your health and cause undesirable symptoms. Hormone replacement therapy (HRT) can relieve some of those symptoms. What is hormone replacement therapy? HRT is the use of artificial (synthetic) hormones to replace hormones that your body has stopped producing because you have reached menopause. What are my options for HRT?  HRT may consist of the synthetic hormones estrogen and progestin, or it may consist of only estrogen (estrogen-only therapy). You and your health care provider will decide which form of HRT is best for you. If you choose to be on HRT and you have a uterus, estrogen and progestin are usually prescribed. Estrogen-only therapy is used for women who do not have a uterus. Possible options for taking HRT include:  Pills.  Patches.  Gels.  Sprays.  Vaginal cream.  Vaginal rings.  Vaginal inserts. The amount of hormone(s) that you take and how long you take the hormone(s) varies according to your health. It is important to:  Begin HRT with the lowest possible dosage.  Stop HRT as soon as your health care provider tells you to stop.  Work with your health care provider so that you feel informed and comfortable with your decisions. What are the benefits of HRT? HRT can reduce the frequency and severity of menopausal symptoms. Benefits of HRT vary according to the kind of symptoms that you have, how severe they are, and your overall health. HRT may help to improve the following symptoms of menopause:  Hot flashes and night sweats. These are sudden feelings of heat that spread over the face and body. The skin may turn red, like a blush. Night sweats are hot flashes that happen while you are sleeping or trying to sleep.  Bone loss (osteoporosis). The  body loses calcium more quickly after menopause, causing the bones to become weaker. This can increase the risk for bone breaks (fractures).  Vaginal dryness. The lining of the vagina can become thin and dry, which can cause pain during sex or cause infection, burning, or itching.  Urinary tract infections.  Urinary incontinence. This is the inability to control when you pass urine.  Irritability.  Short-term memory problems. What are the risks of HRT? Risks of HRT vary depending on your individual health and medical history. Risks of HRT also depend on whether you receive both estrogen and progestin or you receive estrogen only. HRT may increase the risk of:  Spotting. This is when a small amount of blood leaks from the vagina unexpectedly.  Endometrial cancer. This cancer is in the lining of the uterus (endometrium).  Breast cancer.  Increased density of breast tissue. This can make it harder to find breast cancer on a breast X-ray (mammogram).  Stroke.  Heart disease.  Blood clots.  Gallbladder disease.  Liver disease. Risks of HRT can increase if you have any of the following conditions:  Endometrial cancer.  Liver disease.  Heart disease.  Breast cancer.  History of blood clots.  History of stroke. Follow these instructions at home:  Take over-the-counter and prescription medicines only as told by your health care provider.  Get mammograms, pelvic exams, and medical checkups as often as told by your health care provider.  Have Pap tests done as often as told by your health care provider. A Pap test is sometimes called a Pap smear. It   is a screening test that is used to check for signs of cancer of the cervix and vagina. A Pap test can also identify the presence of infection or precancerous changes. Pap tests may be done: ? Every 3 years, starting at age 21. ? Every 5 years, starting after age 30, in combination with testing for human papillomavirus  (HPV). ? More often or less often depending on other medical conditions you have, your age, and other risk factors.  It is up to you to get the results of your Pap test. Ask your health care provider, or the department that is doing the test, when your results will be ready.  Keep all follow-up visits as told by your health care provider. This is important. Contact a health care provider if you have:  Pain or swelling in your legs.  Shortness of breath.  Chest pain.  Lumps or changes in your breasts or armpits.  Slurred speech.  Pain, burning, or bleeding when you urinate.  Unusual vaginal bleeding.  Dizziness or headaches.  Weakness or numbness in any part of your arms or legs.  Pain in your abdomen. Summary  Menopause is a normal time of life when menstrual periods stop completely and the ovaries stop producing the female hormones estrogen and progesterone.  Hormone replacement therapy (HRT) can relieve some of the symptoms of menopause.  HRT can reduce the frequency and severity of menopausal symptoms.  Risks of HRT vary depending on your individual health and medical history. This information is not intended to replace advice given to you by your health care provider. Make sure you discuss any questions you have with your health care provider. Document Revised: 06/15/2018 Document Reviewed: 06/15/2018 Elsevier Patient Education  2020 Elsevier Inc.  

## 2020-07-24 LAB — ESTRADIOL: Estradiol: 34.2 pg/mL

## 2020-07-24 LAB — FOLLICLE STIMULATING HORMONE: FSH: 37.1 m[IU]/mL

## 2020-07-24 LAB — VITAMIN B12: Vitamin B-12: >2000 pg/mL — ABNORMAL HIGH (ref 232–1245)

## 2020-08-03 ENCOUNTER — Other Ambulatory Visit: Payer: Self-pay | Admitting: Family Medicine

## 2020-08-03 DIAGNOSIS — Z1231 Encounter for screening mammogram for malignant neoplasm of breast: Secondary | ICD-10-CM

## 2020-09-05 ENCOUNTER — Other Ambulatory Visit: Payer: Self-pay

## 2020-09-05 ENCOUNTER — Ambulatory Visit
Admission: RE | Admit: 2020-09-05 | Discharge: 2020-09-05 | Disposition: A | Discharge status: Home / Self Care | Payer: 59 | Source: Ambulatory Visit | Attending: Family Medicine | Admitting: Family Medicine

## 2020-09-05 DIAGNOSIS — Z1231 Encounter for screening mammogram for malignant neoplasm of breast: Secondary | ICD-10-CM | POA: Insufficient documentation

## 2020-09-17 ENCOUNTER — Other Ambulatory Visit: Payer: Self-pay

## 2020-09-17 ENCOUNTER — Encounter: Payer: Self-pay | Admitting: Certified Nurse Midwife

## 2020-09-17 ENCOUNTER — Ambulatory Visit (INDEPENDENT_AMBULATORY_CARE_PROVIDER_SITE_OTHER): Payer: 59 | Admitting: Certified Nurse Midwife

## 2020-09-17 VITALS — BP 120/74 | HR 74 | Ht 69.0 in | Wt 265.3 lb

## 2020-09-17 DIAGNOSIS — Z1211 Encounter for screening for malignant neoplasm of colon: Secondary | ICD-10-CM

## 2020-09-17 DIAGNOSIS — Z01419 Encounter for gynecological examination (general) (routine) without abnormal findings: Secondary | ICD-10-CM | POA: Diagnosis not present

## 2020-09-17 MED ORDER — PREMARIN 0.625 MG/GM VA CREA: 1.0000 | TOPICAL_CREAM | Freq: Every day | VAGINAL | 12 refills | Status: DC

## 2020-09-17 MED ORDER — AZITHROMYCIN 1 G PO PACK: 1.0000 g | PACK | Freq: Once | ORAL | 0 refills | Status: AC

## 2020-09-17 NOTE — Progress Notes (Signed)
GYNECOLOGY ANNUAL PREVENTATIVE CARE ENCOUNTER NOTE  History:     Cheryl Clark is a 54 y.o. [phone] female here for a routine annual gynecologic exam.  Current complaints: sinus infections, she has pressure, drainage at the back of her throat, has history of infection. Stopped hormones previously ordered due to side effects of bloating.  Is interested in local treatment for vaginal dryness. Denies abnormal vaginal bleeding, discharge, pelvic pain, problems with intercourse or other gynecologic concerns.     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 Patient has had a hysterectomy. Contraception: status post hysterectomy Last Pap:  Prior to hysterectomy  Results were: normal  Last mammogram: 09/05/2020. Results were: normal  Obstetric History OB History  Gravida Para Term Preterm AB Living  3 2 1 1 1 2   SAB TAB Ectopic Multiple Live Births  1       2    # Outcome Date GA Lbr Len/2nd Weight Sex Delivery Anes PTL Lv  3 Term 09/13/01   9 lb (4.082 kg) M Vag-Spont  N LIV  2 Preterm 06/18/98   5 lb 9 oz (2.523 kg) M Vag-Spont  Y LIV  1 SAB 10/1995            Past Medical History:  Diagnosis Date   Rheumatoid arthritis (HCC)    Thyroid disease   * lower back pain   Past Surgical History:  Procedure Laterality Date   ABDOMINAL HYSTERECTOMY     FRACTURE SURGERY     Left femur rod   REDUCTION MAMMAPLASTY Bilateral 1998   VAGINAL PROLAPSE REPAIR      Current Outpatient Medications on File Prior to Visit  Medication Sig Dispense Refill   ADDERALL XR 20 MG 24 hr capsule Take 20 mg by mouth every morning.     ALPRAZolam (XANAX) 0.25 MG tablet Take 0.25 mg by mouth daily as needed.     buPROPion (WELLBUTRIN XL) 300 MG 24 hr tablet Take 300 mg by mouth daily.     diclofenac Sodium (VOLTAREN) 1 % GEL Apply topically.      fluticasone (FLONASE) 50 MCG/ACT nasal spray Place 1 spray into both nostrils daily.     furosemide (LASIX) 20 MG tablet Take 20 mg by mouth daily.     metFORMIN (GLUCOPHAGE) 500 MG tablet Take 500 mg by mouth 2 (two) times daily.     Multiple Vitamin (MULTI-VITAMIN) tablet Take 1 tablet by mouth daily.     naltrexone (DEPADE) 50 MG tablet Take 2.5 mg by mouth daily.     Naproxen Sodium (ALEVE) 220 MG CAPS Aleve 220 mg capsule   2 capsules every day by oral route.     No current facility-administered medications on file prior to visit.    Allergies  Allergen Reactions   Codeine Itching   Demerol [Meperidine] Itching   Macrobid [Nitrofurantoin Monohyd Macro] Hives   Sulfa Antibiotics Hives    Social History:  reports that she has never smoked. She has never used smokeless tobacco. She reports that she does not drink alcohol and does not use drugs.  Family History  Problem Relation Age of Onset   Breast cancer Paternal Grandmother     The following portions of the patient's history were reviewed and updated as appropriate: allergies, current medications, past family history, past medical history, past social history, past surgical history and problem list.  Review of  Systems Pertinent items noted in HPI and remainder of comprehensive ROS otherwise negative.  Physical Exam:  BP 120/74    Pulse 74    Ht 5\' 9"  (1.753 m)    Wt 265 lb 5 oz (120.3 kg)    BMI 39.18 kg/m  CONSTITUTIONAL: Well-developed, well-nourished, over weight female in no acute distress.  HENT:  Normocephalic, atraumatic, External right and left ear normal. Oropharynx is clear and moist EYES: Conjunctivae and EOM are normal. Pupils are equal, round, and reactive to light. No scleral icterus.  NECK: Normal range of motion, supple, no masses.  Normal thyroid.  SKIN: Skin is warm and dry. No rash noted. Not diaphoretic. No erythema. No pallor. MUSCULOSKELETAL: Normal range of motion. No tenderness.  No  cyanosis, clubbing, or edema.  2+ distal pulses. NEUROLOGIC: Alert and oriented to person, place, and time. Normal reflexes, muscle tone coordination.  PSYCHIATRIC: Normal mood and affect. Normal behavior. Normal judgment and thought content. CARDIOVASCULAR: Normal heart rate noted, regular rhythm RESPIRATORY: Clear to auscultation bilaterally. Effort and breath sounds normal, no problems with respiration noted. BREASTS: Symmetric in size. No masses, tenderness, skin changes, nipple drainage, or lymphadenopathy bilaterally.  ABDOMEN: Soft, no distention noted.  No tenderness, rebound or guarding.  PELVIC: Normal appearing external genitalia and urethral meatus; normal appearing  vaginal mucosa and cervix with normal atrophic changes.  No abnormal discharge noted.  uterine absent, no other palpable masses,  Assessment and Plan:    There are no diagnoses linked to this encounter.  Pap:n/a  Mammogram : done 08/2020 WNL Labs: done by PCP Refills/orders: cologaurd, z pack, estrogen cream Referral: none Routine preventative health maintenance measures emphasized. Please refer to After Visit Summary for other counseling recommendations.      09/2020, CNM [facility],  [facility]

## 2020-09-17 NOTE — Patient Instructions (Signed)

## 2020-09-18 ENCOUNTER — Other Ambulatory Visit: Payer: Self-pay | Admitting: Certified Nurse Midwife

## 2020-09-18 MED ORDER — AZITHROMYCIN 250 MG PO TABS: ORAL_TABLET | ORAL | 0 refills | Status: DC

## 2020-10-04 ENCOUNTER — Encounter: Payer: Self-pay | Admitting: Certified Nurse Midwife

## 2020-10-04 LAB — COLOGUARD: Cologuard: NEGATIVE

## 2020-10-11 LAB — COLOGUARD: COLOGUARD: NEGATIVE

## 2021-03-18 ENCOUNTER — Other Ambulatory Visit: Payer: Self-pay | Admitting: Neurology

## 2021-03-18 DIAGNOSIS — R251 Tremor, unspecified: Secondary | ICD-10-CM

## 2021-03-28 ENCOUNTER — Ambulatory Visit: Payer: 59

## 2021-04-05 ENCOUNTER — Other Ambulatory Visit: Payer: Self-pay

## 2021-04-05 ENCOUNTER — Ambulatory Visit
Admission: RE | Admit: 2021-04-05 | Discharge: 2021-04-05 | Disposition: A | Discharge status: Home / Self Care | Payer: BC Managed Care – PPO | Source: Ambulatory Visit | Attending: Neurology | Admitting: Neurology

## 2021-04-05 DIAGNOSIS — R251 Tremor, unspecified: Secondary | ICD-10-CM | POA: Diagnosis not present

## 2021-05-30 ENCOUNTER — Other Ambulatory Visit: Payer: Self-pay | Admitting: Family Medicine

## 2021-05-30 DIAGNOSIS — Z1231 Encounter for screening mammogram for malignant neoplasm of breast: Secondary | ICD-10-CM

## 2021-07-19 ENCOUNTER — Other Ambulatory Visit: Payer: Self-pay

## 2021-07-19 ENCOUNTER — Emergency Department
Admission: EM | Admit: 2021-07-19 | Discharge: 2021-07-19 | Disposition: A | Discharge status: Home / Self Care | Payer: BC Managed Care – PPO | Attending: Student in an Organized Health Care Education/Training Program | Admitting: Student in an Organized Health Care Education/Training Program

## 2021-07-19 ENCOUNTER — Emergency Department: Payer: BC Managed Care – PPO

## 2021-07-19 DIAGNOSIS — X501XXA Overexertion from prolonged static or awkward postures, initial encounter: Secondary | ICD-10-CM | POA: Diagnosis not present

## 2021-07-19 DIAGNOSIS — S8012XA Contusion of left lower leg, initial encounter: Secondary | ICD-10-CM | POA: Insufficient documentation

## 2021-07-19 DIAGNOSIS — M79605 Pain in left leg: Secondary | ICD-10-CM | POA: Diagnosis not present

## 2021-07-19 DIAGNOSIS — S8992XA Unspecified injury of left lower leg, initial encounter: Secondary | ICD-10-CM | POA: Diagnosis present

## 2021-07-19 LAB — CBC WITH DIFFERENTIAL/PLATELET
Abs Immature Granulocytes: 0.04 10*3/uL (ref 0.00–0.07)
Basophils Absolute: 0.1 10*3/uL (ref 0.0–0.1)
Basophils Relative: 1 %
Eosinophils Absolute: 0.2 10*3/uL (ref 0.0–0.5)
Eosinophils Relative: 2 %
HCT: 45.1 % (ref 36.0–46.0)
Hemoglobin: 15.1 g/dL — ABNORMAL HIGH (ref 12.0–15.0)
Immature Granulocytes: 1 %
Lymphocytes Relative: 42 %
Lymphs Abs: 3.5 10*3/uL (ref 0.7–4.0)
MCH: 30 pg (ref 26.0–34.0)
MCHC: 33.5 g/dL (ref 30.0–36.0)
MCV: 89.7 fL (ref 80.0–100.0)
Monocytes Absolute: 0.7 10*3/uL (ref 0.1–1.0)
Monocytes Relative: 8 %
Neutro Abs: 4 10*3/uL (ref 1.7–7.7)
Neutrophils Relative %: 46 %
Platelets: 280 10*3/uL (ref 150–400)
RBC: 5.03 MIL/uL (ref 3.87–5.11)
RDW: 13.2 % (ref 11.5–15.5)
WBC: 8.5 10*3/uL (ref 4.0–10.5)
nRBC: 0 % (ref 0.0–0.2)

## 2021-07-19 LAB — BASIC METABOLIC PANEL
Anion gap: 7 (ref 5–15)
BUN: 22 mg/dL — ABNORMAL HIGH (ref 6–20)
CO2: 26 mmol/L (ref 22–32)
Calcium: 9 mg/dL (ref 8.9–10.3)
Chloride: 104 mmol/L (ref 98–111)
Creatinine, Ser: 0.88 mg/dL (ref 0.44–1.00)
GFR, Estimated: >60 mL/min (ref >60)
Glucose, Bld: 94 mg/dL (ref 70–99)
Potassium: 4.3 mmol/L (ref 3.5–5.1)
Sodium: 137 mmol/L (ref 135–145)

## 2021-07-19 MED ORDER — OXYCODONE-ACETAMINOPHEN 5-325 MG PO TABS: 1.0000 | ORAL_TABLET | ORAL | 0 refills | Status: DC | PRN

## 2021-07-19 NOTE — ED Triage Notes (Signed)
Pt states she flew across country in the past week and on Tuesday night started having pain and swelling to the left calf

## 2021-07-19 NOTE — ED Provider Notes (Signed)
[facility] Emergency Department Provider Note    Event Date/Time   First MD Initiated Contact with Patient 07/19/21 1215     (approximate)  I have reviewed the triage vital signs and the nursing notes.   HISTORY  Chief Complaint Leg Swelling    HPI Cheryl Clark is a 55 y.o. female with below listed past medical history presents to the ER for evaluation of left calf pain and achiness has been going on for the past several days after she recently flew back from [place].  No history of DVT.  Has noted some scattered bruising and some spider veins that have popped up over the past few days.  Denies any known injury.  No fevers or chills.  Past Medical History:  Diagnosis Date   Rheumatoid arthritis (HCC)    Thyroid disease    Family History  Problem Relation Age of Onset   Breast cancer Paternal Grandmother    Past Surgical History:  Procedure Laterality Date   ABDOMINAL HYSTERECTOMY     FRACTURE SURGERY     Left femur rod   REDUCTION MAMMAPLASTY Bilateral 1998   VAGINAL PROLAPSE REPAIR     There are no problems to display for this patient.     Prior to Admission medications   Medication Sig Start Date End Date Taking? Authorizing Provider  ADDERALL XR 20 MG 24 hr capsule Take 20 mg by mouth every morning. 03/10/20   [provider]  ALPRAZolam Prudy Feeler) 0.25 MG tablet Take 0.25 mg by mouth daily as needed. 04/16/20   [provider]  azithromycin (ZITHROMAX) 250 MG tablet 2 tables day one, 1 tablet daily for 4 days . 09/18/20   Doreene Burke, CNM  buPROPion (WELLBUTRIN XL) 300 MG 24 hr tablet Take 300 mg by mouth daily.    [provider]  conjugated estrogens (PREMARIN) vaginal cream Place 1 Applicatorful vaginally daily. 09/17/20   Doreene Burke, CNM  diclofenac Sodium (VOLTAREN) 1 % GEL Apply topically. 12/30/18   [provider]  fluticasone (FLONASE) 50 MCG/ACT nasal spray Place 1 spray  into both nostrils daily. 07/22/20   [provider]  furosemide (LASIX) 20 MG tablet Take 20 mg by mouth daily. 07/19/20   [provider]  metFORMIN (GLUCOPHAGE) 500 MG tablet Take 500 mg by mouth 2 (two) times daily. 07/26/20   [provider]  Multiple Vitamin (MULTI-VITAMIN) tablet Take 1 tablet by mouth daily.    [provider]  naltrexone (DEPADE) 50 MG tablet Take 2.5 mg by mouth daily.    [provider]  Naproxen Sodium (ALEVE) 220 MG CAPS Aleve 220 mg capsule   2 capsules every day by oral route.    [provider]    Allergies Codeine, Demerol [meperidine], Macrobid [nitrofurantoin monohyd macro], and Sulfa antibiotics    Social History Social History   Tobacco Use   Smoking status: Never   Smokeless tobacco: Never  Substance Use Topics   Alcohol use: No   Drug use: Never    Review of Systems Patient denies headaches, rhinorrhea, blurry vision, numbness, shortness of breath, chest pain, edema, cough, abdominal pain, nausea, vomiting, diarrhea, dysuria, fevers, rashes or hallucinations unless otherwise stated above in HPI. ____________________________________________   PHYSICAL EXAM:  VITAL SIGNS: Vitals:   07/19/21 1158  BP: 119/70  Pulse: 74  Resp: 17  Temp: 98.6 F (37 C)  SpO2: 97%    Constitutional: Alert and oriented.  Eyes: Conjunctivae are normal.  Head: Atraumatic. Nose: No congestion/rhinnorhea. Mouth/Throat: Mucous membranes are moist.   Neck: No stridor. Painless ROM.  Cardiovascular: Normal rate, regular rhythm. Grossly normal heart sounds.  Good peripheral circulation. Respiratory: Normal respiratory effort.  No retractions. Lungs CTAB. Gastrointestinal: Soft and nontender. No distention. No abdominal bruits. No CVA tenderness. Genitourinary:  Musculoskeletal: Some scattered contusions and scattered spider in the lower extremity no petechia.  There is some discomfort of the left calf but  no significant edema, not appreciably larger than the right.   No joint effusions. Neurologic:  Normal speech and language. No gross focal neurologic deficits are appreciated. No facial droop Skin:  Skin is warm, dry and intact. No rash noted. Psychiatric: Mood and affect are normal. Speech and behavior are normal.  ____________________________________________   LABS (all labs ordered are listed, but only abnormal results are displayed)  Results for orders placed or performed during the hospital encounter of 07/19/21 (from the past 24 hour(s))  Basic metabolic panel     Status: Abnormal   Collection Time: 07/19/21 12:31 PM  Result Value Ref Range   Sodium 137 135 - 145 mmol/L   Potassium 4.3 3.5 - 5.1 mmol/L   Chloride 104 98 - 111 mmol/L   CO2 26 22 - 32 mmol/L   Glucose, Bld 94 70 - 99 mg/dL   BUN 22 (H) 6 - 20 mg/dL   Creatinine, Ser 8.25 0.44 - 1.00 mg/dL   Calcium 9.0 8.9 - 00.3 mg/dL   GFR, Estimated >70 >48 mL/min   Anion gap 7 5 - 15  CBC with Differential     Status: Abnormal   Collection Time: 07/19/21 12:31 PM  Result Value Ref Range   WBC 8.5 4.0 - 10.5 K/uL   RBC 5.03 3.87 - 5.11 MIL/uL   Hemoglobin 15.1 (H) 12.0 - 15.0 g/dL   HCT 88.9 16.9 - 45.0 %   MCV 89.7 80.0 - 100.0 fL   MCH 30.0 26.0 - 34.0 pg   MCHC 33.5 30.0 - 36.0 g/dL   RDW 38.8 82.8 - 00.3 %   Platelets 280 150 - 400 K/uL   nRBC 0.0 0.0 - 0.2 %   Neutrophils Relative % 46 %   Neutro Abs 4.0 1.7 - 7.7 K/uL   Lymphocytes Relative 42 %   Lymphs Abs 3.5 0.7 - 4.0 K/uL   Monocytes Relative 8 %   Monocytes Absolute 0.7 0.1 - 1.0 K/uL   Eosinophils Relative 2 %   Eosinophils Absolute 0.2 0.0 - 0.5 K/uL   Basophils Relative 1 %   Basophils Absolute 0.1 0.0 - 0.1 K/uL   Immature Granulocytes 1 %   Abs Immature Granulocytes 0.04 0.00 - 0.07 K/uL   ____________________________________________  EKG____________________________________________  RADIOLOGY  I personally reviewed all radiographic  images ordered to evaluate for the above acute complaints and reviewed radiology reports and findings.  These findings were personally discussed with the patient.  Please see medical record for radiology report.  ____________________________________________   PROCEDURES  Procedure(s) performed:  Procedures    Critical Care performed: no ____________________________________________   INITIAL IMPRESSION / ASSESSMENT AND PLAN / ED COURSE  Pertinent labs & imaging results that were available during my care of the patient were reviewed by me and considered in my medical decision making (see chart for details).   DDX: DVT, contusion, cellulitis, musculoskeletal strain  Cheryl Clark is a 55 y.o. who presents to the ED with presentation as described above.  Patient clinically well-appearing.  Is  not consistent with infectious process.  Able to ambulate not consistent with fracture.  Does have scattered contusions but no sign of DVT.  Blood work reassuring.  Patient stable and appropriate for outpatient follow-up.     The patient was evaluated in Emergency Department today for the symptoms described in the history of present illness. He/she was evaluated in the context of the global COVID-19 pandemic, which necessitated consideration that the patient might be at risk for infection with the SARS-CoV-2 virus that causes COVID-19. Institutional protocols and algorithms that pertain to the evaluation of patients at risk for COVID-19 are in a state of rapid change based on information released by regulatory bodies including the CDC and federal and state organizations. These policies and algorithms were followed during the patient's care in the ED.  As part of my medical decision making, I reviewed the following data within the electronic MEDICAL RECORD NUMBER Nursing notes reviewed and incorporated, Labs reviewed, notes from prior ED visits and [place] Controlled Substance  Database   ____________________________________________   FINAL CLINICAL IMPRESSION(S) / ED DIAGNOSES  Final diagnoses:  Left leg pain      NEW MEDICATIONS STARTED DURING THIS VISIT:  New Prescriptions   No medications on file     Note:  This document was prepared using Dragon voice recognition software and may include unintentional dictation errors.    Willy Eddy, MD 07/19/21 1409

## 2021-09-23 ENCOUNTER — Encounter: Payer: Self-pay | Admitting: Certified Nurse Midwife

## 2021-09-23 ENCOUNTER — Other Ambulatory Visit (HOSPITAL_COMMUNITY)
Admission: RE | Admit: 2021-09-23 | Discharge: 2021-09-23 | Disposition: A | Discharge status: Home / Self Care | Payer: BC Managed Care – PPO | Source: Ambulatory Visit | Attending: Certified Nurse Midwife | Admitting: Certified Nurse Midwife

## 2021-09-23 ENCOUNTER — Ambulatory Visit (INDEPENDENT_AMBULATORY_CARE_PROVIDER_SITE_OTHER): Payer: BC Managed Care – PPO | Admitting: Certified Nurse Midwife

## 2021-09-23 ENCOUNTER — Other Ambulatory Visit: Payer: Self-pay

## 2021-09-23 VITALS — BP 112/80 | HR 83 | Ht 69.0 in | Wt 221.0 lb

## 2021-09-23 DIAGNOSIS — N949 Unspecified condition associated with female genital organs and menstrual cycle: Secondary | ICD-10-CM | POA: Diagnosis not present

## 2021-09-23 DIAGNOSIS — Z113 Encounter for screening for infections with a predominantly sexual mode of transmission: Secondary | ICD-10-CM | POA: Diagnosis not present

## 2021-09-23 DIAGNOSIS — Z01419 Encounter for gynecological examination (general) (routine) without abnormal findings: Secondary | ICD-10-CM | POA: Diagnosis not present

## 2021-09-23 DIAGNOSIS — N898 Other specified noninflammatory disorders of vagina: Secondary | ICD-10-CM

## 2021-09-23 MED ORDER — PREMARIN 0.625 MG/GM VA CREA: TOPICAL_CREAM | Freq: Every day | VAGINAL | 3 refills | Status: DC

## 2021-09-23 NOTE — Progress Notes (Signed)
GYNECOLOGY ANNUAL PREVENTATIVE CARE ENCOUNTER NOTE  History:     Cheryl Clark is a 54 y.o. [phone] female here for a routine annual gynecologic exam.  Current complaints: vaginal burning.   Denies abnormal vaginal bleeding, discharge, pelvic pain, problems with intercourse or other gynecologic concerns. Lost 50 lbs with [facility] weight loss.     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 Patient has had a hysterectomy. Contraception: status post hysterectomy Last Pap: n/a .  Last mammogram: 09/05/2020. Results were: normal  Obstetric History OB History  Gravida Para Term Preterm AB Living  3 2 1 1 1 2   SAB IAB Ectopic Multiple Live Births  1       2    # Outcome Date GA Lbr Len/2nd Weight Sex Delivery Anes PTL Lv  3 Term 09/13/01   9 lb (4.082 kg) M Vag-Spont  N LIV  2 Preterm 06/18/98   5 lb 9 oz (2.523 kg) M Vag-Spont  Y LIV  1 SAB 10/1995            Past Medical History:  Diagnosis Date   Rheumatoid arthritis (HCC)    Thyroid disease     Past Surgical History:  Procedure Laterality Date   ABDOMINAL HYSTERECTOMY     FRACTURE SURGERY     Left femur rod   REDUCTION MAMMAPLASTY Bilateral 1998   VAGINAL PROLAPSE REPAIR      Current Outpatient Medications on File Prior to Visit  Medication Sig Dispense Refill   ADDERALL XR 20 MG 24 hr capsule Take 20 mg by mouth every morning.     ALPRAZolam (XANAX) 0.25 MG tablet Take 0.25 mg by mouth daily as needed.     buPROPion (WELLBUTRIN XL) 300 MG 24 hr tablet Take 300 mg by mouth daily.     furosemide (LASIX) 20 MG tablet Take 20 mg by mouth daily.     Multiple Vitamin (MULTI-VITAMIN) tablet Take 1 tablet by mouth daily.     naltrexone (DEPADE) 50 MG tablet Take 2.5 mg by mouth daily.     tirzepatide (MOUNJARO) 7.5 MG/0.5ML Pen Inject 7.5 mg into the skin once a week.      diclofenac Sodium (VOLTAREN) 1 % GEL Apply topically. (Patient not taking: Reported on 09/23/2021)     fluticasone (FLONASE) 50 MCG/ACT nasal spray Place 1 spray into both nostrils daily. (Patient not taking: Reported on 09/23/2021)     Naproxen Sodium 220 MG CAPS Aleve 220 mg capsule   2 capsules every day by oral route. (Patient not taking: Reported on 09/23/2021)     oxyCODONE-acetaminophen (PERCOCET) 5-325 MG tablet Take 1 tablet by mouth every 4 (four) hours as needed for severe pain. (Patient not taking: Reported on 09/23/2021) 8 tablet 0   No current facility-administered medications on file prior to visit.    Allergies  Allergen Reactions   Codeine Itching   Demerol [Meperidine] Itching   Macrobid [Nitrofurantoin Monohyd Macro] Hives   Sulfa Antibiotics Hives    Social History:  reports that she has never smoked. She has never used smokeless tobacco. She reports that she does not drink alcohol and does not use drugs.  Family History  Problem Relation Age of Onset   Breast cancer Paternal Grandmother     The following portions of the patient's history were reviewed and updated as appropriate: allergies, current medications, past family history,  past medical history, past social history, past surgical history and problem list.  Review of Systems Pertinent items noted in HPI and remainder of comprehensive ROS otherwise negative.  Physical Exam:  BP 112/80   Pulse 83   Ht 5\' 9"  (1.753 m)   Wt 221 lb (100.2 kg)   BMI 32.64 kg/m  CONSTITUTIONAL: Well-developed, well-nourished female in no acute distress.  HENT:  Normocephalic, atraumatic, External right and left ear normal. Oropharynx is clear and moist EYES: Conjunctivae and EOM are normal. Pupils are equal, round, and reactive to light. No scleral icterus.  NECK: Normal range of motion, supple, no masses.  Normal thyroid.  SKIN: Skin is warm and dry. No rash noted. Not diaphoretic. No erythema. No pallor. MUSCULOSKELETAL:  Normal range of motion. No tenderness.  No cyanosis, clubbing, or edema.  2+ distal pulses. NEUROLOGIC: Alert and oriented to person, place, and time. Normal reflexes, muscle tone coordination.  PSYCHIATRIC: Normal mood and affect. Normal behavior. Normal judgment and thought content. CARDIOVASCULAR: Normal heart rate noted, regular rhythm RESPIRATORY: Clear to auscultation bilaterally. Effort and breath sounds normal, no problems with respiration noted. BREASTS: Symmetric in size. No masses, tenderness, skin changes, nipple drainage, or lymphadenopathy bilaterally.  ABDOMEN: Soft, no distention noted.  No tenderness, rebound or guarding.  PELVIC: Normal appearing external genitalia and urethral meatus; normal appearing vaginal mucosa , mild redness.  No abnormal discharge noted.  Pap smear not indicated , cervix absent Normal uterine size, no other palpable masses, no uterine or adnexal tenderness.  .   Assessment and Plan:    Annual [facility] Women GYN Exam   Pap: n/a  Mammogram : scheduled for 10/22/21 Colonoscopy: cologaurd 2021 negative Labs:Vitamin B , Vitamin D, HIV , Hep C Refills: Premarin cream  Referral: none  Routine preventative health maintenance measures emphasized. Please refer to After Visit Summary for other counseling recommendations.      2022, CNM [facility],  [facility]

## 2021-09-25 ENCOUNTER — Other Ambulatory Visit: Payer: Self-pay | Admitting: Certified Nurse Midwife

## 2021-09-25 ENCOUNTER — Encounter: Payer: Self-pay | Admitting: Certified Nurse Midwife

## 2021-09-25 ENCOUNTER — Other Ambulatory Visit: Payer: Self-pay

## 2021-09-25 LAB — CERVICOVAGINAL ANCILLARY ONLY
Bacterial Vaginitis (gardnerella): NEGATIVE
Candida Glabrata: NEGATIVE
Candida Vaginitis: POSITIVE — AB
Comment: NEGATIVE
Comment: NEGATIVE
Comment: NEGATIVE

## 2021-09-25 MED ORDER — FLUCONAZOLE 150 MG PO TABS: 150.0000 mg | ORAL_TABLET | Freq: Every day | ORAL | 0 refills | Status: DC

## 2021-09-25 NOTE — Telephone Encounter (Signed)
Yes

## 2021-09-26 LAB — HIV ANTIBODY (ROUTINE TESTING W REFLEX): HIV Screen 4th Generation wRfx: NONREACTIVE

## 2021-09-26 LAB — VITAMIN D 25 HYDROXY (VIT D DEFICIENCY, FRACTURES): Vit D, 25-Hydroxy: 28.3 ng/mL — ABNORMAL LOW (ref 30.0–100.0)

## 2021-09-26 LAB — VITAMIN B12: Vitamin B-12: 661 pg/mL (ref 232–1245)

## 2021-09-26 LAB — VITAMIN B6: Vitamin B6: 29.4 ug/L (ref 3.4–65.2)

## 2021-09-26 LAB — HEPATITIS C ANTIBODY: Hep C Virus Ab: 0.3 s/co ratio (ref 0.0–0.9)

## 2021-10-14 ENCOUNTER — Other Ambulatory Visit: Payer: Self-pay | Admitting: Certified Nurse Midwife

## 2021-10-16 ENCOUNTER — Other Ambulatory Visit: Payer: Self-pay

## 2021-10-16 MED ORDER — FLUCONAZOLE 150 MG PO TABS: 150.0000 mg | ORAL_TABLET | Freq: Every day | ORAL | 0 refills | Status: DC

## 2021-10-22 ENCOUNTER — Other Ambulatory Visit: Payer: Self-pay

## 2021-10-22 ENCOUNTER — Ambulatory Visit
Admission: RE | Admit: 2021-10-22 | Discharge: 2021-10-22 | Disposition: A | Discharge status: Home / Self Care | Payer: BC Managed Care – PPO | Source: Ambulatory Visit | Attending: Family Medicine | Admitting: Family Medicine

## 2021-10-22 DIAGNOSIS — Z1231 Encounter for screening mammogram for malignant neoplasm of breast: Secondary | ICD-10-CM | POA: Diagnosis present

## 2021-11-27 HISTORY — PX: REDUCTION MAMMAPLASTY: SUR839

## 2022-01-14 ENCOUNTER — Encounter: Payer: Self-pay | Admitting: Certified Nurse Midwife

## 2022-01-24 ENCOUNTER — Other Ambulatory Visit: Payer: Self-pay | Admitting: Certified Nurse Midwife

## 2022-01-24 MED ORDER — CIPROFLOXACIN HCL 250 MG PO TABS: 250.0000 mg | ORAL_TABLET | Freq: Two times a day (BID) | ORAL | 0 refills | Status: AC

## 2022-05-27 ENCOUNTER — Ambulatory Visit
Admission: EM | Admit: 2022-05-27 | Discharge: 2022-05-27 | Disposition: A | Discharge status: Home / Self Care | Payer: BC Managed Care – PPO

## 2022-05-27 DIAGNOSIS — R110 Nausea: Secondary | ICD-10-CM | POA: Diagnosis not present

## 2022-05-27 DIAGNOSIS — K625 Hemorrhage of anus and rectum: Secondary | ICD-10-CM | POA: Diagnosis not present

## 2022-05-27 DIAGNOSIS — R1013 Epigastric pain: Secondary | ICD-10-CM | POA: Diagnosis not present

## 2022-05-27 NOTE — ED Triage Notes (Signed)
Pt c/o abdominal pain x4days  Pt has had Loose stool and states that 30 to 45 min after eating she has abdominal cramping with acid reflux.   Pt states that she was crying in the fetal position due to abdominal pain and does not want to go to the ER.  Pt took Zofran last night for nausea and it helped with the intensity but did not get rid of the nausea.   Pt last bowel movement was this morning. Pt denies any abnormal color. Pt states that this bowel movements did not have as much blood as previous ones. The blood was bright red and pt has a photo.

## 2022-05-27 NOTE — ED Notes (Signed)
Patient is being discharged from the Urgent Care and sent to the Emergency Department via Personal Vehicle . Per Nehemiah Settle, [place], patient is in need of higher level of care due to Abdominal Pain and Blood in Stool. Patient is aware and verbalizes understanding of plan of care.  Vitals:   05/27/22 1037  BP: 100/75  Pulse: 77  Resp: 18  Temp: 98.8 F (37.1 C)  SpO2: 99%

## 2022-05-27 NOTE — Discharge Instructions (Signed)
ABDOMINAL PAIN: You may take Tylenol for pain relief. Use medications as directed including antiemetics and antidiarrheal medications if suggested or prescribed. You should increase fluids and electrolytes as well as rest over these next several days. If you have any questions or concerns, or if your symptoms are not improving or if especially if they acutely worsen, please call or stop back to the clinic immediately and we will be happy to help you or go to the ER   ABDOMINAL PAIN RED FLAGS: Seek immediate further care if: symptoms remain the same or worsen over the next 3-7 days, you are unable to keep fluids down, you see blood or mucus in your stool, you vomit black or dark red material, you have a fever of 101.F or higher, you have localized and/or persistent abdominal pain   

## 2022-05-27 NOTE — ED Provider Notes (Signed)
[facility]    CSN: [id number] Arrival date & time: 05/27/22  1012      History   Chief Complaint Chief Complaint  Patient presents with   Abdominal Pain    HPI Cheryl Clark is a 56 y.o. female presenting for 4-day history of severe epigastric, periumbilical and left upper quadrant abdominal pain.  Patient says pain is significantly worse after eating.  Also reports nausea without vomiting, diarrhea or constipation.  She does report looser/soft stools with bright red blood and clots in the stool.  She showed me a photograph.  Patient reports her pain was so bad last night she was crying in the fetal position and told her husband she did not want to go to the emergency department.  She is hoping to have work-up in the urgent care today including a CT scan.  She denies any associated fevers.  Has been increasingly fatigued and says that her pain has increasingly worsened since the onset.  Denies any history of GI issues.  She denies NSAID use, frequent alcohol use or tobacco use.  History of rheumatoid arthritis and fibromyalgia.  HPI  Past Medical History:  Diagnosis Date   Rheumatoid arthritis (HCC)    Thyroid disease     There are no problems to display for this patient.   Past Surgical History:  Procedure Laterality Date   ABDOMINAL HYSTERECTOMY     FRACTURE SURGERY     Left femur rod   REDUCTION MAMMAPLASTY Bilateral 1998   VAGINAL PROLAPSE REPAIR      OB History     Gravida  3   Para  2   Term  1   Preterm  1   AB  1   Living  2      SAB  1   IAB      Ectopic      Multiple      Live Births  2            Home Medications    Prior to Admission medications   Medication Sig Start Date End Date Taking? Authorizing Provider  ADDERALL XR 20 MG 24 hr capsule Take 20 mg by mouth every morning. 03/10/20  Yes [provider]  ALPRAZolam (XANAX) 0.25 MG tablet Take 0.25 mg by mouth daily as needed. 04/16/20  Yes  [provider]  conjugated estrogens (PREMARIN) vaginal cream Place vaginally daily. Daily 3 wk on , 1 wk off 09/23/21  Yes Doreene Burke, CNM  furosemide (LASIX) 20 MG tablet Take 20 mg by mouth daily. 07/19/20  Yes [provider]  Multiple Vitamin (MULTI-VITAMIN) tablet Take 1 tablet by mouth daily.   Yes [provider]  naltrexone (DEPADE) 50 MG tablet Take 2.5 mg by mouth daily.   Yes [provider]  tirzepatide Greggory Keen) 7.5 MG/0.5ML Pen Inject 7.5 mg into the skin once a week.   Yes [provider]  buPROPion (WELLBUTRIN XL) 300 MG 24 hr tablet Take 300 mg by mouth daily.    [provider]  fluconazole (DIFLUCAN) 150 MG tablet Take 1 tablet (150 mg total) by mouth daily. 10/16/21   Doreene Burke, CNM    Family History Family History  Problem Relation Age of Onset   Breast cancer Maternal Grandmother    Breast cancer Paternal Grandmother     Social History Social History   Tobacco Use   Smoking status: Never   Smokeless tobacco: Never  Vaping Use   Vaping  Use: Never used  Substance Use Topics   Alcohol use: No   Drug use: Never     Allergies   Codeine, Demerol [meperidine], Macrobid [nitrofurantoin monohyd macro], and Sulfa antibiotics   Review of Systems Review of Systems  Constitutional:  Positive for appetite change and fatigue. Negative for fever.  Respiratory:  Negative for shortness of breath.   Cardiovascular:  Negative for chest pain.  Gastrointestinal:  Positive for abdominal pain, blood in stool and nausea. Negative for constipation, diarrhea, rectal pain and vomiting.  Musculoskeletal:  Negative for back pain.  Neurological:  Negative for dizziness and headaches.     Physical Exam Triage Vital Signs ED Triage Vitals  Enc Vitals Group     BP      Pulse      Resp      Temp      Temp src      SpO2      Weight      Height      Head Circumference      Peak Flow      Pain Score       Pain Loc      Pain Edu?      Excl. in GC?    No data found.  Updated Vital Signs BP 100/75 (BP Location: Left Arm)   Pulse 77   Temp 98.8 F (37.1 C) (Oral)   Resp 18   Ht 5\' 9"  (1.753 m)   Wt 192 lb (87.1 kg)   SpO2 99%   BMI 28.35 kg/m       Physical Exam Vitals and nursing note reviewed.  Constitutional:      General: She is not in acute distress.    Appearance: Normal appearance. She is well-developed. She is ill-appearing. She is not toxic-appearing.  HENT:     Head: Normocephalic and atraumatic.     Nose: Nose normal.     Mouth/Throat:     Mouth: Mucous membranes are moist.     Pharynx: Oropharynx is clear.  Eyes:     General: No scleral icterus.       Right eye: No discharge.        Left eye: No discharge.     Conjunctiva/sclera: Conjunctivae normal.  Cardiovascular:     Rate and Rhythm: Normal rate and regular rhythm.     Heart sounds: Normal heart sounds.  Pulmonary:     Effort: Pulmonary effort is normal. No respiratory distress.     Breath sounds: Normal breath sounds.  Abdominal:     Tenderness: There is abdominal tenderness in the epigastric area, periumbilical area and left upper quadrant. There is guarding (epigastric).  Musculoskeletal:     Cervical back: Neck supple.  Skin:    General: Skin is dry.  Neurological:     General: No focal deficit present.     Mental Status: She is alert. Mental status is at baseline.     Motor: No weakness.     Gait: Gait normal.  Psychiatric:        Mood and Affect: Mood normal.        Behavior: Behavior normal.        Thought Content: Thought content normal.      UC Treatments / Results  Labs (all labs ordered are listed, but only abnormal results are displayed) Labs Reviewed - No data to display  EKG   Radiology No results found.  Procedures Procedures (including critical care time)  Medications Ordered  in UC Medications - No data to display  Initial Impression / Assessment and Plan / UC  Course  I have reviewed the triage vital signs and the nursing notes.  Pertinent labs & imaging results that were available during my care of the patient were reviewed by me and considered in my medical decision making (see chart for details).  56 year old female presenting for severe epigastric, periumbilical and left upper quadrant abdominal pain x4 days.  Pain slowly worsening from onset and associated with fatigue, nausea.  Pain significantly worse after eating.  Denies fever, constipation or diarrhea.  Has had bright red blood in stool 3-4 times.  Vitals are stable but blood pressure is a bit low at 100/75.  She is mildly ill-appearing but nontoxic.  On exam chest is clear auscultation heart regular rate and rhythm.  Abdomen is soft with tenderness at the epigastric, periumbilical and left lower quadrant regions.  Guarding of the epigastric region.  No CVA tenderness.  DDX: PUD, pancreatitis, mesenteric ischemia, bowel obstruction, diverticulitis/diverticulosis, gastritis, cholecystitis, AAA.  I had a discussion with patient about my concern for a GI bleed and potential acute blood loss/anemia related to this.  Advised her I suggest further work-up in the emergency department at this time as we are limited in urgent care and do not have CT here today.  Advised that she likely needs lab work-up, IV fluids, potentially blood if she is anemic, potentially consult with GI.  Possible admission if significant GI bleed or acute abdomen.  Patient is reluctantly agreeable to take herself to [facility] ED at [facility].  She is leaving in stable condition at this time.   Final Clinical Impressions(s) / UC Diagnoses   Final diagnoses:  Epigastric pain  Bright red blood per rectum  Nausea     Discharge Instructions      ABDOMINAL PAIN: You may take Tylenol for pain relief. Use medications as directed including antiemetics and antidiarrheal medications if suggested or prescribed. You should increase fluids  and electrolytes as well as rest over these next several days. If you have any questions or concerns, or if your symptoms are not improving or if especially if they acutely worsen, please call or stop back to the clinic immediately and we will be happy to help you or go to the ER   ABDOMINAL PAIN RED FLAGS: Seek immediate further care if: symptoms remain the same or worsen over the next 3-7 days, you are unable to keep fluids down, you see blood or mucus in your stool, you vomit black or dark red material, you have a fever of 101.F or higher, you have localized and/or persistent abdominal pain       ED Prescriptions   None    PDMP not reviewed this encounter.   Shirlee Latch, PA-C 05/27/22 1101

## 2022-08-21 ENCOUNTER — Other Ambulatory Visit: Payer: Self-pay | Admitting: Family Medicine

## 2022-08-21 DIAGNOSIS — R7303 Prediabetes: Secondary | ICD-10-CM | POA: Insufficient documentation

## 2022-08-21 DIAGNOSIS — Z1231 Encounter for screening mammogram for malignant neoplasm of breast: Secondary | ICD-10-CM

## 2022-08-26 ENCOUNTER — Encounter: Payer: Self-pay | Admitting: Oncology

## 2022-08-26 ENCOUNTER — Inpatient Hospital Stay: Payer: BC Managed Care – PPO | Attending: Oncology | Admitting: Oncology

## 2022-08-26 ENCOUNTER — Inpatient Hospital Stay: Payer: BC Managed Care – PPO

## 2022-08-26 VITALS — BP 107/66 | HR 70 | Temp 99.2°F | Ht 68.0 in | Wt 193.0 lb

## 2022-08-26 DIAGNOSIS — D751 Secondary polycythemia: Secondary | ICD-10-CM

## 2022-08-26 DIAGNOSIS — R790 Abnormal level of blood mineral: Secondary | ICD-10-CM | POA: Diagnosis not present

## 2022-08-26 DIAGNOSIS — R7989 Other specified abnormal findings of blood chemistry: Secondary | ICD-10-CM | POA: Insufficient documentation

## 2022-08-26 DIAGNOSIS — R5383 Other fatigue: Secondary | ICD-10-CM | POA: Diagnosis not present

## 2022-08-26 LAB — CBC
HCT: 46.8 % — ABNORMAL HIGH (ref 36.0–46.0)
Hemoglobin: 15.7 g/dL — ABNORMAL HIGH (ref 12.0–15.0)
MCH: 30.2 pg (ref 26.0–34.0)
MCHC: 33.5 g/dL (ref 30.0–36.0)
MCV: 90 fL (ref 80.0–100.0)
Platelets: 253 10*3/uL (ref 150–400)
RBC: 5.2 MIL/uL — ABNORMAL HIGH (ref 3.87–5.11)
RDW: 12 % (ref 11.5–15.5)
WBC: 5.9 10*3/uL (ref 4.0–10.5)
nRBC: 0 % (ref 0.0–0.2)

## 2022-08-26 LAB — FOLATE: Folate: 12 ng/mL (ref >5.9)

## 2022-08-26 LAB — IRON AND TIBC
Iron: 112 ug/dL (ref 28–170)
Saturation Ratios: 33 % — ABNORMAL HIGH (ref 10.4–31.8)
TIBC: 343 ug/dL (ref 250–450)
UIBC: 231 ug/dL

## 2022-08-26 LAB — VITAMIN B12: Vitamin B-12: 1349 pg/mL — ABNORMAL HIGH (ref 180–914)

## 2022-08-26 LAB — FERRITIN: Ferritin: 80 ng/mL (ref 11–307)

## 2022-08-26 NOTE — Progress Notes (Signed)
[place]  [phone] Fax:(336) [phone]  ID: Cheryl Clark OB: [birth year]  MR#: [id number]  QIO#:[id number]  Patient Care Team: Valera Castle, MD as PCP - General (Family Medicine)  CHIEF COMPLAINT: Polycythemia.  INTERVAL HISTORY: Patient is a 56 year old female with a longstanding history of mild polycythemia.  She is referred for further evaluation.  She is anxious, but otherwise feels well.  She has no neurologic complaints.  She denies any recent fevers or illnesses.  She has increased chronic fatigue.  She has no chest pain, shortness of breath, cough, or hemoptysis.  She denies any nausea, vomiting, constipation, or diarrhea.  She has no urinary complaints.  Patient otherwise feels well and offers no further specific complaints today.  REVIEW OF SYSTEMS:   Review of Systems  Constitutional:  Positive for malaise/fatigue. Negative for fever and weight loss.  Respiratory: Negative.  Negative for cough, hemoptysis and shortness of breath.   Cardiovascular: Negative.  Negative for chest pain and leg swelling.  Gastrointestinal: Negative.  Negative for abdominal pain.  Genitourinary: Negative.  Negative for dysuria.  Musculoskeletal: Negative.  Negative for back pain.  Skin: Negative.  Negative for rash.  Neurological: Negative.  Negative for dizziness, focal weakness, weakness and headaches.  Psychiatric/Behavioral:  The patient is nervous/anxious.     As per HPI. Otherwise, a complete review of systems is negative.  PAST MEDICAL HISTORY: Past Medical History:  Diagnosis Date   Rheumatoid arthritis ([place])    Thyroid disease     PAST SURGICAL HISTORY: Past Surgical History:  Procedure Laterality Date   ABDOMINAL HYSTERECTOMY     FRACTURE SURGERY     Left femur rod   REDUCTION MAMMAPLASTY Bilateral 1998   VAGINAL PROLAPSE REPAIR      FAMILY HISTORY: Family History  Problem Relation Age of Onset   Breast cancer  Maternal Grandmother    Breast cancer Paternal Grandmother     ADVANCED DIRECTIVES (Y/N):  N  HEALTH MAINTENANCE: Social History   Tobacco Use   Smoking status: Never   Smokeless tobacco: Never  Vaping Use   Vaping Use: Never used  Substance Use Topics   Alcohol use: No   Drug use: Never     Colonoscopy:  PAP:  Bone density:  Lipid panel:  Allergies  Allergen Reactions   Codeine Itching   Demerol [Meperidine] Itching   Macrobid [Nitrofurantoin Monohyd Macro] Hives   Sulfa Antibiotics Hives    Current Outpatient Medications  Medication Sig Dispense Refill   ADDERALL XR 20 MG 24 hr capsule Take 20 mg by mouth every morning.     ALPRAZolam (XANAX) 0.25 MG tablet Take 0.25 mg by mouth daily as needed.     buPROPion (WELLBUTRIN XL) 300 MG 24 hr tablet Take 300 mg by mouth daily.     conjugated estrogens (PREMARIN) vaginal cream Place vaginally daily. Daily 3 wk on , 1 wk off 42.5 g 3   fluconazole (DIFLUCAN) 150 MG tablet Take 1 tablet (150 mg total) by mouth daily. 1 tablet 0   furosemide (LASIX) 20 MG tablet Take 20 mg by mouth daily.     Multiple Vitamin (MULTI-VITAMIN) tablet Take 1 tablet by mouth daily.     naltrexone (DEPADE) 50 MG tablet Take 2.5 mg by mouth daily.     tirzepatide (MOUNJARO) 7.5 MG/0.5ML Pen Inject 7.5 mg into the skin once a week.     No current facility-administered medications for this visit.    OBJECTIVE: Vitals:  08/26/22 1005  BP: 107/66  Pulse: 70  Temp: 99.2 F (37.3 C)  SpO2: 100%     Body mass index is 29.35 kg/m.    ECOG FS:0 - Asymptomatic  General: Well-developed, well-nourished, no acute distress. Eyes: Pink conjunctiva, anicteric sclera. HEENT: Normocephalic, moist mucous membranes. Lungs: No audible wheezing or coughing. Heart: Regular rate and rhythm. Abdomen: Soft, nontender, no obvious distention. Musculoskeletal: No edema, cyanosis, or clubbing. Neuro: Alert, answering all questions appropriately. Cranial nerves  grossly intact. Skin: No rashes or petechiae noted. Psych: Normal affect. Lymphatics: No cervical, calvicular, axillary or inguinal LAD.   LAB RESULTS:  Lab Results  Component Value Date   NA 137 07/19/2021   K 4.3 07/19/2021   CL 104 07/19/2021   CO2 26 07/19/2021   GLUCOSE 94 07/19/2021   BUN 22 (H) 07/19/2021   CREATININE 0.88 07/19/2021   CALCIUM 9.0 07/19/2021   GFRNONAA >60 07/19/2021   GFRAA >60 06/09/2015    Lab Results  Component Value Date   WBC 5.9 08/26/2022   NEUTROABS 4.0 07/19/2021   HGB 15.7 (H) 08/26/2022   HCT 46.8 (H) 08/26/2022   MCV 90.0 08/26/2022   PLT 253 08/26/2022   Lab Results  Component Value Date   IRON 112 08/26/2022   TIBC 343 08/26/2022   IRONPCTSAT 33 (H) 08/26/2022   Lab Results  Component Value Date   FERRITIN 80 08/26/2022     STUDIES: No results found.  ASSESSMENT: Polycythemia.  PLAN:    Polycythemia: Upon review of patient's record, she had a hemoglobin of 15.0 in August 2016.  She has trended up slightly over that timeframe with a current hemoglobin of 15.7.  She has elevated B12 levels which are likely clinically insignificant.  Iron panel is essentially within normal limits other than a mildly increased saturation ratio.  Can consider hemochromatosis testing in the future.  Erythropoietin levels are appropriately within normal limits.  JAK2 mutation was ordered for completeness and is pending at time of dictation.  No intervention is needed at this time.  Patient will have video-assisted telemedicine visit in 3 weeks to discuss the results. Fatigue: Likely multifactorial.  Unrelated to the polycythemia.  I spent a total of 45 minutes reviewing chart data, face-to-face evaluation with the patient, counseling and coordination of care as detailed above.   Patient expressed understanding and was in agreement with this plan. She also understands that She can call clinic at any time with any questions, concerns, or  complaints.     Jeralyn Ruths, MD   08/27/2022 2:17 PM

## 2022-08-27 LAB — ERYTHROPOIETIN: Erythropoietin: 7.3 m[IU]/mL (ref 2.6–18.5)

## 2022-08-28 LAB — CARBON MONOXIDE, BLOOD (PERFORMED AT REF LAB): Carbon Monoxide, Blood: 3.4 % (ref 0.0–3.6)

## 2022-09-01 LAB — CALR +MPL + E12-E15  (REFLEX)

## 2022-09-01 LAB — JAK2 V617F RFX CALR/MPL/E12-15

## 2022-09-16 ENCOUNTER — Inpatient Hospital Stay: Payer: BC Managed Care – PPO | Attending: Oncology | Admitting: Oncology

## 2022-09-16 DIAGNOSIS — D751 Secondary polycythemia: Secondary | ICD-10-CM | POA: Diagnosis not present

## 2022-09-16 NOTE — Progress Notes (Signed)
[facility]  Telephone:(336) [phone] Fax:(336) [phone]  ID: Cheryl Clark OB: [birth year]  MR#: [id number]  XNT#:[id number]  Patient Care Team: Dione Housekeeper, MD as PCP - General (Family Medicine)  I connected with Cheryl Clark on 09/17/22 at  1:30 PM EST by video enabled telemedicine visit and verified that I am speaking with the correct person using two identifiers.   I discussed the limitations, risks, security and privacy concerns of performing an evaluation and management service by telemedicine and the availability of in-person appointments. I also discussed with the patient that there may be a patient responsible charge related to this service. The patient expressed understanding and agreed to proceed.   Other persons participating in the visit and their role in the encounter: Patient, MD.  Patient's location: Home. Provider's location: Clinic.  CHIEF COMPLAINT: Polycythemia.  INTERVAL HISTORY: Patient agreed to video assisted telemedicine visit for further evaluation and discussion of her laboratory results.  She continues to be significantly fatigued, but otherwise feels well.  She has no neurologic complaints.  She denies any recent fevers or illnesses.  She has no chest pain, shortness of breath, cough, or hemoptysis.  She denies any nausea, vomiting, constipation, or diarrhea.  She has no urinary complaints.  Patient offers no further specific complaints today.  REVIEW OF SYSTEMS:   Review of Systems  Constitutional:  Positive for malaise/fatigue. Negative for fever and weight loss.  Respiratory: Negative.  Negative for cough, hemoptysis and shortness of breath.   Cardiovascular: Negative.  Negative for chest pain and leg swelling.  Gastrointestinal: Negative.  Negative for abdominal pain.  Genitourinary: Negative.  Negative for dysuria.  Musculoskeletal: Negative.  Negative for back pain.  Skin: Negative.  Negative  for rash.  Neurological: Negative.  Negative for dizziness, focal weakness, weakness and headaches.  Psychiatric/Behavioral: Negative.  The patient is not nervous/anxious.     As per HPI. Otherwise, a complete review of systems is negative.  PAST MEDICAL HISTORY: Past Medical History:  Diagnosis Date   Rheumatoid arthritis (HCC)    Thyroid disease     PAST SURGICAL HISTORY: Past Surgical History:  Procedure Laterality Date   ABDOMINAL HYSTERECTOMY     FRACTURE SURGERY     Left femur rod   REDUCTION MAMMAPLASTY Bilateral 1998   VAGINAL PROLAPSE REPAIR      FAMILY HISTORY: Family History  Problem Relation Age of Onset   Breast cancer Maternal Grandmother    Breast cancer Paternal Grandmother     ADVANCED DIRECTIVES (Y/N):  N  HEALTH MAINTENANCE: Social History   Tobacco Use   Smoking status: Never   Smokeless tobacco: Never  Vaping Use   Vaping Use: Never used  Substance Use Topics   Alcohol use: No   Drug use: Never     Colonoscopy:  PAP:  Bone density:  Lipid panel:  Allergies  Allergen Reactions   Codeine Itching   Demerol [Meperidine] Itching   Macrobid [Nitrofurantoin Monohyd Macro] Hives   Sulfa Antibiotics Hives    Current Outpatient Medications  Medication Sig Dispense Refill   ADDERALL XR 20 MG 24 hr capsule Take 20 mg by mouth every morning.     ALPRAZolam (XANAX) 0.25 MG tablet Take 0.25 mg by mouth daily as needed.     buPROPion (WELLBUTRIN XL) 300 MG 24 hr tablet Take 300 mg by mouth daily.     Multiple Vitamin (MULTI-VITAMIN) tablet Take 1 tablet by mouth daily.     tirzepatide [facility]) 7.5  MG/0.5ML Pen Inject 7.5 mg into the skin once a week.     No current facility-administered medications for this visit.    OBJECTIVE: There were no vitals filed for this visit.    There is no height or weight on file to calculate BMI.    ECOG FS:0 - Asymptomatic  General: Well-developed, well-nourished, no acute distress. HEENT:  Normocephalic. Neuro: Alert, answering all questions appropriately. Cranial nerves grossly intact. Psych: Normal affect.  LAB RESULTS:  Lab Results  Component Value Date   NA 137 07/19/2021   K 4.3 07/19/2021   CL 104 07/19/2021   CO2 26 07/19/2021   GLUCOSE 94 07/19/2021   BUN 22 (H) 07/19/2021   CREATININE 0.88 07/19/2021   CALCIUM 9.0 07/19/2021   GFRNONAA >60 07/19/2021   GFRAA >60 06/09/2015    Lab Results  Component Value Date   WBC 5.9 08/26/2022   NEUTROABS 4.0 07/19/2021   HGB 15.7 (H) 08/26/2022   HCT 46.8 (H) 08/26/2022   MCV 90.0 08/26/2022   PLT 253 08/26/2022   Lab Results  Component Value Date   IRON 112 08/26/2022   TIBC 343 08/26/2022   IRONPCTSAT 33 (H) 08/26/2022   Lab Results  Component Value Date   FERRITIN 80 08/26/2022     STUDIES: No results found.  ASSESSMENT: Polycythemia.  PLAN:    Polycythemia: Upon review of patient's record, she had a hemoglobin of 15.0 in August 2016.  She has trended up slightly over that timeframe with a current hemoglobin of 15.7.  She has elevated B12 levels which are likely clinically insignificant.  Iron panel is essentially within normal limits other than a mildly increased saturation ratio.  Can consider hemochromatosis testing in the future.  Erythropoietin levels are appropriately within normal limits.  JAK2 mutation with reflex is negative.  After discussion with the patient, she wishes to try phlebotomy to see if this improves her fatigue.  She is going on a trip to [place], so therefore will return to clinic in several weeks for phlebotomy only.  She would then have laboratory work and video assisted telemedicine visit in mid January for further evaluation.  I provided 20 minutes of face-to-face video visit time during this encounter which included chart review, counseling, and coordination of care as documented above.    Patient expressed understanding and was in agreement with this plan. She also  understands that She can call clinic at any time with any questions, concerns, or complaints.     Jeralyn Ruths, MD   09/17/2022 6:57 AM

## 2022-09-16 NOTE — Progress Notes (Unsigned)
Pt states she has fatigue all the time. States today's visit is to discuss treatment for her.

## 2022-09-17 ENCOUNTER — Encounter: Payer: Self-pay | Admitting: Oncology

## 2022-09-17 DIAGNOSIS — D751 Secondary polycythemia: Secondary | ICD-10-CM | POA: Insufficient documentation

## 2022-09-23 ENCOUNTER — Telehealth: Payer: Self-pay | Admitting: Certified Nurse Midwife

## 2022-09-23 NOTE — Telephone Encounter (Signed)
Reached out to pt to reschedule annual appt that was scheduled with Doreene Burke on 11/29 at 1:30.  Left message for pt to call back to reschedule.

## 2022-09-24 ENCOUNTER — Encounter: Payer: BC Managed Care – PPO | Admitting: Certified Nurse Midwife

## 2022-09-29 ENCOUNTER — Inpatient Hospital Stay: Payer: BC Managed Care – PPO | Attending: Oncology

## 2022-09-29 DIAGNOSIS — D751 Secondary polycythemia: Secondary | ICD-10-CM | POA: Insufficient documentation

## 2022-09-29 NOTE — Patient Instructions (Signed)

## 2022-10-07 NOTE — Telephone Encounter (Signed)
Per pt per scheduled change / per pt cancel at this time) 09/23/22 at 1:57 pm.

## 2022-10-23 ENCOUNTER — Ambulatory Visit: Payer: BC Managed Care – PPO

## 2022-10-30 ENCOUNTER — Ambulatory Visit
Admission: RE | Admit: 2022-10-30 | Discharge: 2022-10-30 | Disposition: A | Discharge status: Home / Self Care | Payer: BC Managed Care – PPO | Source: Ambulatory Visit | Attending: Family Medicine | Admitting: Family Medicine

## 2022-10-30 DIAGNOSIS — Z1231 Encounter for screening mammogram for malignant neoplasm of breast: Secondary | ICD-10-CM | POA: Diagnosis not present

## 2022-11-02 ENCOUNTER — Encounter: Payer: Self-pay | Admitting: Oncology

## 2022-11-03 ENCOUNTER — Inpatient Hospital Stay: Payer: BC Managed Care – PPO | Attending: Oncology

## 2022-11-03 DIAGNOSIS — D751 Secondary polycythemia: Secondary | ICD-10-CM | POA: Insufficient documentation

## 2022-11-03 LAB — CBC WITH DIFFERENTIAL/PLATELET
Abs Immature Granulocytes: 0.02 10*3/uL (ref 0.00–0.07)
Basophils Absolute: 0 10*3/uL (ref 0.0–0.1)
Basophils Relative: 1 %
Eosinophils Absolute: 0.1 10*3/uL (ref 0.0–0.5)
Eosinophils Relative: 1 %
HCT: 45 % (ref 36.0–46.0)
Hemoglobin: 15.4 g/dL — ABNORMAL HIGH (ref 12.0–15.0)
Immature Granulocytes: 0 %
Lymphocytes Relative: 36 %
Lymphs Abs: 2.5 10*3/uL (ref 0.7–4.0)
MCH: 30.2 pg (ref 26.0–34.0)
MCHC: 34.2 g/dL (ref 30.0–36.0)
MCV: 88.2 fL (ref 80.0–100.0)
Monocytes Absolute: 0.6 10*3/uL (ref 0.1–1.0)
Monocytes Relative: 8 %
Neutro Abs: 3.8 10*3/uL (ref 1.7–7.7)
Neutrophils Relative %: 54 %
Platelets: 301 10*3/uL (ref 150–400)
RBC: 5.1 MIL/uL (ref 3.87–5.11)
RDW: 12.6 % (ref 11.5–15.5)
WBC: 6.9 10*3/uL (ref 4.0–10.5)
nRBC: 0 % (ref 0.0–0.2)

## 2022-11-03 LAB — IRON AND TIBC
Iron: 111 ug/dL (ref 28–170)
Saturation Ratios: 28 % (ref 10.4–31.8)
TIBC: 393 ug/dL (ref 250–450)
UIBC: 282 ug/dL

## 2022-11-03 LAB — FERRITIN: Ferritin: 53 ng/mL (ref 11–307)

## 2022-11-06 ENCOUNTER — Inpatient Hospital Stay (HOSPITAL_BASED_OUTPATIENT_CLINIC_OR_DEPARTMENT_OTHER): Payer: BC Managed Care – PPO | Admitting: Oncology

## 2022-11-06 DIAGNOSIS — D751 Secondary polycythemia: Secondary | ICD-10-CM

## 2022-11-06 NOTE — Progress Notes (Addendum)
Cheryl Clark  Telephone:(336) [phone] Fax:(336) [phone]  ID: Cheryl Clark OB: 15-Feb-1966  MR#: [id number]  TDH#:[id number]  Patient Care Team: Valera Castle, MD as PCP - General (Family Medicine)  I connected with Cheryl Clark on 11/10/22 at  3:30 PM EST by video enabled telemedicine visit and verified that I am speaking with the correct person using two identifiers.   I discussed the limitations, risks, security and privacy concerns of performing an evaluation and management service by telemedicine and the availability of in-person appointments. I also discussed with the patient that there may be a patient responsible charge related to this service. The patient expressed understanding and agreed to proceed.   Other persons participating in the visit and their role in the encounter: Patient, MD.  Patient's location: Home. Provider's location: Clinic.  CHIEF COMPLAINT: Polycythemia, heterozygote for hemochromatosis.  INTERVAL HISTORY: Patient agreed to video assisted telemedicine visit for further evaluation, discussion of her laboratory results, and consideration of additional phlebotomy.  Patient states after her recent phlebotomy treatment she felt significantly improved with decreased fatigue.  She currently feels well.  She has no neurologic complaints.  She denies any recent fevers or illnesses.  She has no chest pain, shortness of breath, cough, or hemoptysis.  She denies any nausea, vomiting, constipation, or diarrhea.  She has no urinary complaints.  Patient offers no further specific complaints today.  REVIEW OF SYSTEMS:   Review of Systems  Constitutional: Negative.  Negative for fever, malaise/fatigue and weight loss.  Respiratory: Negative.  Negative for cough, hemoptysis and shortness of breath.   Cardiovascular: Negative.  Negative for chest pain and leg swelling.  Gastrointestinal: Negative.  Negative for abdominal  pain.  Genitourinary: Negative.  Negative for dysuria.  Musculoskeletal: Negative.  Negative for back pain.  Skin: Negative.  Negative for rash.  Neurological: Negative.  Negative for dizziness, focal weakness, weakness and headaches.  Psychiatric/Behavioral: Negative.  The patient is not nervous/anxious.     As per HPI. Otherwise, a complete review of systems is negative.  PAST MEDICAL HISTORY: Past Medical History:  Diagnosis Date   Rheumatoid arthritis ([place])    Thyroid disease     PAST SURGICAL HISTORY: Past Surgical History:  Procedure Laterality Date   ABDOMINAL HYSTERECTOMY     FRACTURE SURGERY     Left femur rod   REDUCTION MAMMAPLASTY Bilateral 11/2021   Collagen mesh implant per patient   VAGINAL PROLAPSE REPAIR      FAMILY HISTORY: Family History  Problem Relation Age of Onset   Breast cancer Maternal Grandmother    Breast cancer Paternal Grandmother     ADVANCED DIRECTIVES (Y/N):  N  HEALTH MAINTENANCE: Social History   Tobacco Use   Smoking status: Never   Smokeless tobacco: Never  Vaping Use   Vaping Use: Never used  Substance Use Topics   Alcohol use: No   Drug use: Never     Colonoscopy:  PAP:  Bone density:  Lipid panel:  Allergies  Allergen Reactions   Codeine Itching   Demerol [Meperidine] Itching   Macrobid [Nitrofurantoin Monohyd Macro] Hives   Sulfa Antibiotics Hives    Current Outpatient Medications  Medication Sig Dispense Refill   ADDERALL XR 20 MG 24 hr capsule Take 20 mg by mouth every morning.     ALPRAZolam (XANAX) 0.25 MG tablet Take 0.25 mg by mouth daily as needed.     buPROPion (WELLBUTRIN XL) 300 MG 24 hr tablet Take 300 mg by mouth  daily.     Multiple Vitamin (MULTI-VITAMIN) tablet Take 1 tablet by mouth daily.     tirzepatide (MOUNJARO) 7.5 MG/0.5ML Pen Inject 7.5 mg into the skin once a week.     No current facility-administered medications for this visit.    OBJECTIVE: There were no vitals filed for this  visit.    There is no height or weight on file to calculate BMI.    ECOG FS:0 - Asymptomatic  General: Well-developed, well-nourished, no acute distress. HEENT: Normocephalic. Neuro: Alert, answering all questions appropriately. Cranial nerves grossly intact. Psych: Normal affect.  LAB RESULTS:  Lab Results  Component Value Date   NA 137 07/19/2021   K 4.3 07/19/2021   CL 104 07/19/2021   CO2 26 07/19/2021   GLUCOSE 94 07/19/2021   BUN 22 (H) 07/19/2021   CREATININE 0.88 07/19/2021   CALCIUM 9.0 07/19/2021   GFRNONAA >60 07/19/2021   GFRAA >60 06/09/2015    Lab Results  Component Value Date   WBC 6.9 11/03/2022   NEUTROABS 3.8 11/03/2022   HGB 15.4 (H) 11/03/2022   HCT 45.0 11/03/2022   MCV 88.2 11/03/2022   PLT 301 11/03/2022   Lab Results  Component Value Date   IRON 111 11/03/2022   TIBC 393 11/03/2022   IRONPCTSAT 28 11/03/2022   Lab Results  Component Value Date   FERRITIN 53 11/03/2022     STUDIES: MM 3D SCREEN BREAST BILATERAL  Result Date: 10/30/2022 CLINICAL DATA:  Screening. Personal history of BILATERAL reduction mammoplasty in February, 2023. This is the initial post reduction screening mammogram. EXAM: DIGITAL SCREENING BILATERAL MAMMOGRAM WITH TOMOSYNTHESIS AND CAD TECHNIQUE: Bilateral screening digital craniocaudal and mediolateral oblique mammograms were obtained. Bilateral screening digital breast tomosynthesis was performed. The images were evaluated with computer-aided detection. COMPARISON:  Previous exam(s). ACR Breast Density Category a: The breast tissue is almost entirely fatty. FINDINGS: There are no findings suspicious for malignancy. Expected changes in both breasts related to the reduction mammoplasty. IMPRESSION: No mammographic evidence of malignancy. A result letter of this screening mammogram will be mailed directly to the patient. RECOMMENDATION: Screening mammogram in one year. (Code:SM-B-01Y) BI-RADS CATEGORY  2: Benign. Electronically  Signed   By: Evangeline Dakin M.D.   On: 10/30/2022 16:21    ASSESSMENT: Polycythemia, heterozygote for hemochromatosis.  PLAN:    Polycythemia: Patient's hemoglobin has improved with recent phlebotomy and she is also less symptomatic.  Her hemoglobin is 15.4 today.  Previously all of her other laboratory work including iron panel, erythropoietin levels, JAK2 mutation with reflex are all negative or within normal limits.  Patient has been instructed to keep her phlebotomy appointment scheduled for November 10, 2022 with a goal hemoglobin of 15.0 or less.  Patient will then return to clinic in mid February for laboratory work, video-assisted telemedicine visit, and consideration of additional phlebotomy.   Heterozygote hemochromatosis: Patient previously noted to have an increased iron saturation ratio of 33% which resolved with phlebotomy.  No intervention is needed. Keep phlebotomy appointment as above.  I provided 20 minutes of face-to-face video visit time during this encounter which included chart review, counseling, and coordination of care as documented above.   Patient expressed understanding and was in agreement with this plan. She also understands that She can call clinic at any time with any questions, concerns, or complaints.     Lloyd Huger, MD   11/10/2022 9:34 PM

## 2022-11-10 ENCOUNTER — Inpatient Hospital Stay: Payer: BC Managed Care – PPO

## 2022-11-10 ENCOUNTER — Encounter: Payer: Self-pay | Admitting: Oncology

## 2022-11-10 VITALS — BP 111/74 | HR 73 | Temp 98.0°F | Resp 20

## 2022-11-10 DIAGNOSIS — D751 Secondary polycythemia: Secondary | ICD-10-CM

## 2022-11-10 LAB — HEMOCHROMATOSIS DNA-PCR(C282Y,H63D)

## 2022-11-11 ENCOUNTER — Encounter: Payer: Self-pay | Admitting: Oncology

## 2022-12-08 ENCOUNTER — Encounter: Payer: Self-pay | Admitting: Oncology

## 2022-12-08 ENCOUNTER — Other Ambulatory Visit: Payer: Self-pay | Admitting: *Deleted

## 2022-12-08 DIAGNOSIS — D751 Secondary polycythemia: Secondary | ICD-10-CM

## 2022-12-09 ENCOUNTER — Inpatient Hospital Stay: Payer: BC Managed Care – PPO | Attending: Oncology

## 2022-12-09 DIAGNOSIS — D751 Secondary polycythemia: Secondary | ICD-10-CM

## 2022-12-09 LAB — FERRITIN: Ferritin: 29 ng/mL (ref 11–307)

## 2022-12-09 LAB — CBC WITH DIFFERENTIAL/PLATELET
Abs Immature Granulocytes: 0.03 10*3/uL (ref 0.00–0.07)
Basophils Absolute: 0.1 10*3/uL (ref 0.0–0.1)
Basophils Relative: 1 %
Eosinophils Absolute: 0.1 10*3/uL (ref 0.0–0.5)
Eosinophils Relative: 2 %
HCT: 44.8 % (ref 36.0–46.0)
Hemoglobin: 15 g/dL (ref 12.0–15.0)
Immature Granulocytes: 0 %
Lymphocytes Relative: 40 %
Lymphs Abs: 3.1 10*3/uL (ref 0.7–4.0)
MCH: 30.3 pg (ref 26.0–34.0)
MCHC: 33.5 g/dL (ref 30.0–36.0)
MCV: 90.5 fL (ref 80.0–100.0)
Monocytes Absolute: 0.7 10*3/uL (ref 0.1–1.0)
Monocytes Relative: 9 %
Neutro Abs: 3.7 10*3/uL (ref 1.7–7.7)
Neutrophils Relative %: 48 %
Platelets: 294 10*3/uL (ref 150–400)
RBC: 4.95 MIL/uL (ref 3.87–5.11)
RDW: 12.2 % (ref 11.5–15.5)
WBC: 7.6 10*3/uL (ref 4.0–10.5)
nRBC: 0 % (ref 0.0–0.2)

## 2022-12-09 LAB — IRON AND TIBC
Iron: 81 ug/dL (ref 28–170)
Saturation Ratios: 22 % (ref 10.4–31.8)
TIBC: 367 ug/dL (ref 250–450)
UIBC: 286 ug/dL

## 2022-12-11 ENCOUNTER — Inpatient Hospital Stay (HOSPITAL_BASED_OUTPATIENT_CLINIC_OR_DEPARTMENT_OTHER): Payer: BC Managed Care – PPO | Admitting: Oncology

## 2022-12-11 DIAGNOSIS — D751 Secondary polycythemia: Secondary | ICD-10-CM | POA: Diagnosis not present

## 2022-12-11 NOTE — Progress Notes (Signed)
[place]  Telephone:(336) [phone] Fax:(336) [phone]  ID: Cheryl Clark OB: January 26, 1966  MR#: [id number]  [id number]  Patient Care Team: Valera Castle, MD as PCP - General (Family Medicine)  I connected with Cheryl Clark on 12/11/22 at  3:30 PM EST by video enabled telemedicine visit and verified that I am speaking with the correct person using two identifiers.   I discussed the limitations, risks, security and privacy concerns of performing an evaluation and management service by telemedicine and the availability of in-person appointments. I also discussed with the patient that there may be a patient responsible charge related to this service. The patient expressed understanding and agreed to proceed.   Other persons participating in the visit and their role in the encounter: Patient, MD.  Patient's location: Home. Provider's location: Clinic.  CHIEF COMPLAINT: Polycythemia, heterozygote for hemochromatosis.  INTERVAL HISTORY: Patient agreed to video assisted telemedicine visit for further evaluation, discussion of her laboratory results, and consideration of additional phlebotomy if needed.  She currently feels well and is asymptomatic.  She does not complain of weakness or fatigue today.  She has no neurologic complaints.  She denies any recent fevers or illnesses.  She has no chest pain, shortness of breath, cough, or hemoptysis.  She denies any nausea, vomiting, constipation, or diarrhea.  She has no urinary complaints.  Patient offers no specific complaints today.  REVIEW OF SYSTEMS:   Review of Systems  Constitutional: Negative.  Negative for fever, malaise/fatigue and weight loss.  Respiratory: Negative.  Negative for cough, hemoptysis and shortness of breath.   Cardiovascular: Negative.  Negative for chest pain and leg swelling.  Gastrointestinal: Negative.  Negative for abdominal pain.  Genitourinary: Negative.   Negative for dysuria.  Musculoskeletal: Negative.  Negative for back pain.  Skin: Negative.  Negative for rash.  Neurological: Negative.  Negative for dizziness, focal weakness, weakness and headaches.  Psychiatric/Behavioral: Negative.  The patient is not nervous/anxious.     As per HPI. Otherwise, a complete review of systems is negative.  PAST MEDICAL HISTORY: Past Medical History:  Diagnosis Date   Rheumatoid arthritis ([place])    Thyroid disease     PAST SURGICAL HISTORY: Past Surgical History:  Procedure Laterality Date   ABDOMINAL HYSTERECTOMY     FRACTURE SURGERY     Left femur rod   REDUCTION MAMMAPLASTY Bilateral 11/2021   Collagen mesh implant per patient   VAGINAL PROLAPSE REPAIR      FAMILY HISTORY: Family History  Problem Relation Age of Onset   Breast cancer Maternal Grandmother    Breast cancer Paternal Grandmother     ADVANCED DIRECTIVES (Y/N):  N  HEALTH MAINTENANCE: Social History   Tobacco Use   Smoking status: Never   Smokeless tobacco: Never  Vaping Use   Vaping Use: Never used  Substance Use Topics   Alcohol use: No   Drug use: Never     Colonoscopy:  PAP:  Bone density:  Lipid panel:  Allergies  Allergen Reactions   Codeine Itching   Demerol [Meperidine] Itching   Macrobid [Nitrofurantoin Monohyd Macro] Hives   Sulfa Antibiotics Hives    Current Outpatient Medications  Medication Sig Dispense Refill   ADDERALL XR 20 MG 24 hr capsule Take 20 mg by mouth every morning.     ALPRAZolam (XANAX) 0.25 MG tablet Take 0.25 mg by mouth daily as needed.     buPROPion (WELLBUTRIN XL) 300 MG 24 hr tablet Take 300 mg by mouth daily.  Multiple Vitamin (MULTI-VITAMIN) tablet Take 1 tablet by mouth daily.     tirzepatide (MOUNJARO) 7.5 MG/0.5ML Pen Inject 7.5 mg into the skin once a week.     No current facility-administered medications for this visit.    OBJECTIVE: There were no vitals filed for this visit.    There is no height or  weight on file to calculate BMI.    ECOG FS:0 - Asymptomatic  General: Well-developed, well-nourished, no acute distress. HEENT: Normocephalic. Neuro: Alert, answering all questions appropriately. Cranial nerves grossly intact. Psych: Normal affect.   LAB RESULTS:  Lab Results  Component Value Date   NA 137 07/19/2021   K 4.3 07/19/2021   CL 104 07/19/2021   CO2 26 07/19/2021   GLUCOSE 94 07/19/2021   BUN 22 (H) 07/19/2021   CREATININE 0.88 07/19/2021   CALCIUM 9.0 07/19/2021   GFRNONAA >60 07/19/2021   GFRAA >60 06/09/2015    Lab Results  Component Value Date   WBC 7.6 12/09/2022   NEUTROABS 3.7 12/09/2022   HGB 15.0 12/09/2022   HCT 44.8 12/09/2022   MCV 90.5 12/09/2022   PLT 294 12/09/2022   Lab Results  Component Value Date   IRON 81 12/09/2022   TIBC 367 12/09/2022   IRONPCTSAT 22 12/09/2022   Lab Results  Component Value Date   FERRITIN 29 12/09/2022     STUDIES: No results found.  ASSESSMENT: Polycythemia, heterozygote for hemochromatosis.  PLAN:    Polycythemia: Resolved.  Patient's hemoglobin is 15.0 today. Previously all of her other laboratory work including iron panel, erythropoietin levels, JAK2 mutation with reflex are all negative or within normal limits.  Patient does not require additional phlebotomy at this time.  Goal hemoglobin will be 15.0 or less.  Patient will have repeat laboratory work and video-assisted telemedicine visit in 6 months. Heterozygote hemochromatosis: Iron saturation ratios are now 22% with ferritin of 29.  No intervention is needed at this time.  Follow-up as above.  I provided 20 minutes of face-to-face video visit time during this encounter which included chart review, counseling, and coordination of care as documented above.    Patient expressed understanding and was in agreement with this plan. She also understands that She can call clinic at any time with any questions, concerns, or complaints.     Lloyd Huger, MD   12/13/2022 7:27 AM

## 2022-12-13 ENCOUNTER — Encounter: Payer: Self-pay | Admitting: Oncology

## 2023-02-09 ENCOUNTER — Other Ambulatory Visit: Payer: Self-pay | Admitting: Internal Medicine

## 2023-02-09 DIAGNOSIS — N6310 Unspecified lump in the right breast, unspecified quadrant: Secondary | ICD-10-CM

## 2023-02-13 ENCOUNTER — Ambulatory Visit
Admission: RE | Admit: 2023-02-13 | Discharge: 2023-02-13 | Disposition: A | Discharge status: Home / Self Care | Payer: BC Managed Care – PPO | Source: Ambulatory Visit | Attending: Internal Medicine | Admitting: Internal Medicine

## 2023-02-13 DIAGNOSIS — N6310 Unspecified lump in the right breast, unspecified quadrant: Secondary | ICD-10-CM | POA: Diagnosis present

## 2023-05-27 ENCOUNTER — Encounter: Payer: Self-pay | Admitting: Oncology

## 2023-05-28 ENCOUNTER — Telehealth: Payer: Self-pay | Admitting: Oncology

## 2023-05-28 ENCOUNTER — Encounter: Payer: Self-pay | Admitting: Oncology

## 2023-05-28 NOTE — Telephone Encounter (Signed)
Patients labs from primary came back high- she is traveling next Wednesday- she would like phlebotomy Friday-Monday or Tuesday- Please advise scheduling

## 2023-06-01 ENCOUNTER — Inpatient Hospital Stay: Payer: BC Managed Care – PPO | Attending: Oncology

## 2023-06-01 VITALS — BP 108/70 | HR 72 | Resp 20

## 2023-06-01 DIAGNOSIS — D751 Secondary polycythemia: Secondary | ICD-10-CM | POA: Insufficient documentation

## 2023-06-15 ENCOUNTER — Inpatient Hospital Stay: Payer: BC Managed Care – PPO

## 2023-06-15 DIAGNOSIS — D751 Secondary polycythemia: Secondary | ICD-10-CM

## 2023-06-15 LAB — IRON AND TIBC
Iron: 85 ug/dL (ref 28–170)
Saturation Ratios: 23 % (ref 10.4–31.8)
TIBC: 367 ug/dL (ref 250–450)
UIBC: 282 ug/dL

## 2023-06-15 LAB — CBC WITH DIFFERENTIAL (CANCER CENTER ONLY)
Abs Immature Granulocytes: 0.07 10*3/uL (ref 0.00–0.07)
Basophils Absolute: 0 10*3/uL (ref 0.0–0.1)
Basophils Relative: 1 %
Eosinophils Absolute: 0.1 10*3/uL (ref 0.0–0.5)
Eosinophils Relative: 1 %
HCT: 44.7 % (ref 36.0–46.0)
Hemoglobin: 14.8 g/dL (ref 12.0–15.0)
Immature Granulocytes: 1 %
Lymphocytes Relative: 36 %
Lymphs Abs: 2.3 10*3/uL (ref 0.7–4.0)
MCH: 30.1 pg (ref 26.0–34.0)
MCHC: 33.1 g/dL (ref 30.0–36.0)
MCV: 91 fL (ref 80.0–100.0)
Monocytes Absolute: 0.6 10*3/uL (ref 0.1–1.0)
Monocytes Relative: 9 %
Neutro Abs: 3.4 10*3/uL (ref 1.7–7.7)
Neutrophils Relative %: 52 %
Platelet Count: 284 10*3/uL (ref 150–400)
RBC: 4.91 MIL/uL (ref 3.87–5.11)
RDW: 12.8 % (ref 11.5–15.5)
WBC Count: 6.3 10*3/uL (ref 4.0–10.5)
nRBC: 0 % (ref 0.0–0.2)

## 2023-06-15 LAB — FERRITIN: Ferritin: 22 ng/mL (ref 11–307)

## 2023-06-16 ENCOUNTER — Inpatient Hospital Stay: Payer: BC Managed Care – PPO

## 2023-06-18 ENCOUNTER — Other Ambulatory Visit: Payer: Self-pay

## 2023-06-18 ENCOUNTER — Ambulatory Visit: Payer: BC Managed Care – PPO | Admitting: Oncology

## 2023-06-18 DIAGNOSIS — D751 Secondary polycythemia: Secondary | ICD-10-CM

## 2023-06-19 ENCOUNTER — Encounter: Payer: Self-pay | Admitting: Oncology

## 2023-06-19 ENCOUNTER — Inpatient Hospital Stay: Payer: BC Managed Care – PPO | Admitting: Oncology

## 2023-06-19 VITALS — BP 109/69 | HR 80 | Temp 98.4°F | Resp 16 | Ht 68.0 in | Wt 208.0 lb

## 2023-06-19 DIAGNOSIS — D751 Secondary polycythemia: Secondary | ICD-10-CM

## 2023-06-19 NOTE — Progress Notes (Signed)
[facility]  [phone]) [phone] [phone]) [phone]  ID: Amil Amen OB: 06/25/1966  MR#: [id number]  YQM#:[id number]  Patient Care Team: Dione Housekeeper, MD as PCP - General (Family Medicine)  CHIEF COMPLAINT: Polycythemia, heterozygote for hemochromatosis.  INTERVAL HISTORY: Patient returns to clinic today for repeat laboratory work and further evaluation.  She currently feels well and is asymptomatic.  She feels significantly improved after phlebotomy approximately 2 weeks ago.  She does not complain of weakness or fatigue today.  She has no neurologic complaints.  She denies any recent fevers or illnesses.  She has no chest pain, shortness of breath, cough, or hemoptysis.  She denies any nausea, vomiting, constipation, or diarrhea.  She has no urinary complaints.  Patient offers no specific complaints today.  REVIEW OF SYSTEMS:   Review of Systems  Constitutional: Negative.  Negative for fever, malaise/fatigue and weight loss.  Respiratory: Negative.  Negative for cough, hemoptysis and shortness of breath.   Cardiovascular: Negative.  Negative for chest pain and leg swelling.  Gastrointestinal: Negative.  Negative for abdominal pain.  Genitourinary: Negative.  Negative for dysuria.  Musculoskeletal: Negative.  Negative for back pain.  Skin: Negative.  Negative for rash.  Neurological: Negative.  Negative for dizziness, focal weakness, weakness and headaches.  Psychiatric/Behavioral: Negative.  The patient is not nervous/anxious.     As per HPI. Otherwise, a complete review of systems is negative.  PAST MEDICAL HISTORY: Past Medical History:  Diagnosis Date   Rheumatoid arthritis ([facility])    Thyroid disease     PAST SURGICAL HISTORY: Past Surgical History:  Procedure Laterality Date   ABDOMINAL HYSTERECTOMY     FRACTURE SURGERY     Left femur rod   REDUCTION MAMMAPLASTY Bilateral 11/2021   Collagen mesh implant per patient    VAGINAL PROLAPSE REPAIR      FAMILY HISTORY: Family History  Problem Relation Age of Onset   Breast cancer Maternal Grandmother    Breast cancer Paternal Grandmother     ADVANCED DIRECTIVES (Y/N):  N  HEALTH MAINTENANCE: Social History   Tobacco Use   Smoking status: Never   Smokeless tobacco: Never  Vaping Use   Vaping status: Never Used  Substance Use Topics   Alcohol use: No   Drug use: Never     Colonoscopy:  PAP:  Bone density:  Lipid panel:  Allergies  Allergen Reactions   Iodinated Contrast Media Anxiety, Itching, Nausea Only, Palpitations and Shortness Of Breath    Generalized, Nausea   Codeine Itching   Demerol [Meperidine] Itching   Macrobid [Nitrofurantoin Monohyd Macro] Hives   Sulfa Antibiotics Hives    Current Outpatient Medications  Medication Sig Dispense Refill   ADDERALL XR 20 MG 24 hr capsule Take 20 mg by mouth every morning.     ALPRAZolam (XANAX) 0.25 MG tablet Take 0.25 mg by mouth daily as needed.     celecoxib (CELEBREX) 100 MG capsule Take 100 mg by mouth 2 (two) times daily.     hydrOXYzine (ATARAX) 25 MG tablet Take 1 tablet by mouth 3 (three) times daily as needed.     Multiple Vitamin (MULTI-VITAMIN) tablet Take 1 tablet by mouth daily.     omeprazole (PRILOSEC) 40 MG capsule Take 40 mg by mouth daily.     ondansetron (ZOFRAN-ODT) 4 MG disintegrating tablet Take 4 mg by mouth every 8 (eight) hours as needed.     traZODone (DESYREL) 50 MG tablet Take 1 tablet by mouth at bedtime as  needed.     tirzepatide (MOUNJARO) 7.5 MG/0.5ML Pen Inject 7.5 mg into the skin once a week. (Patient not taking: Reported on 06/19/2023)     No current facility-administered medications for this visit.    OBJECTIVE: Vitals:   06/19/23 0919  BP: 109/69  Pulse: 80  Resp: 16  Temp: 98.4 F (36.9 C)  SpO2: 100%      Body mass index is 31.63 kg/m.    ECOG FS:0 - Asymptomatic  General: Well-developed, well-nourished, no acute distress. Eyes: Pink  conjunctiva, anicteric sclera. HEENT: Normocephalic, moist mucous membranes. Lungs: No audible wheezing or coughing. Heart: Regular rate and rhythm. Abdomen: Soft, nontender, no obvious distention. Musculoskeletal: No edema, cyanosis, or clubbing. Neuro: Alert, answering all questions appropriately. Cranial nerves grossly intact. Skin: No rashes or petechiae noted. Psych: Normal affect.   LAB RESULTS:  Lab Results  Component Value Date   NA 137 07/19/2021   K 4.3 07/19/2021   CL 104 07/19/2021   CO2 26 07/19/2021   GLUCOSE 94 07/19/2021   BUN 22 (H) 07/19/2021   CREATININE 0.88 07/19/2021   CALCIUM 9.0 07/19/2021   GFRNONAA >60 07/19/2021   GFRAA >60 06/09/2015    Lab Results  Component Value Date   WBC 6.3 06/15/2023   NEUTROABS 3.4 06/15/2023   HGB 14.8 06/15/2023   HCT 44.7 06/15/2023   MCV 91.0 06/15/2023   PLT 284 06/15/2023   Lab Results  Component Value Date   IRON 85 06/15/2023   TIBC 367 06/15/2023   IRONPCTSAT 23 06/15/2023   Lab Results  Component Value Date   FERRITIN 22 06/15/2023     STUDIES: No results found.  ASSESSMENT: Polycythemia, heterozygote for hemochromatosis.  PLAN:    Polycythemia: Resolved.  Patient's hemoglobin is 14.8 today.  It was recently reported at an outside lab the patient's hemoglobin was 16.8 and she received phlebotomy on June 01, 2023.  Previously, all of her other laboratory work including iron panel, erythropoietin levels, JAK2 mutation with reflex are all negative or within normal limits.  Patient does not require additional phlebotomy at this time.  Goal hemoglobin will be 15.0 or less.  Return to clinic in 3 months for laboratory work only and then in 6 months for laboratory work and further evaluation. Heterozygote hemochromatosis: Likely clinically insignificant with ferritin of 22 and iron saturation ratio of 23%.    Patient expressed understanding and was in agreement with this plan. She also understands  that She can call clinic at any time with any questions, concerns, or complaints.     Jeralyn Ruths, MD   06/19/2023 9:34 AM

## 2023-07-17 ENCOUNTER — Encounter: Payer: Self-pay | Admitting: Oncology

## 2023-07-28 ENCOUNTER — Inpatient Hospital Stay: Payer: BC Managed Care – PPO

## 2023-07-28 ENCOUNTER — Inpatient Hospital Stay: Payer: BC Managed Care – PPO | Attending: Oncology

## 2023-07-28 VITALS — BP 113/77 | HR 77 | Resp 20

## 2023-07-28 DIAGNOSIS — D751 Secondary polycythemia: Secondary | ICD-10-CM

## 2023-07-28 LAB — IRON AND TIBC
Iron: 79 ug/dL (ref 28–170)
Saturation Ratios: 21 % (ref 10.4–31.8)
TIBC: 377 ug/dL (ref 250–450)
UIBC: 298 ug/dL

## 2023-07-28 LAB — CBC WITH DIFFERENTIAL (CANCER CENTER ONLY)
Abs Immature Granulocytes: 0.12 10*3/uL — ABNORMAL HIGH (ref 0.00–0.07)
Basophils Absolute: 0.1 10*3/uL (ref 0.0–0.1)
Basophils Relative: 0 %
Eosinophils Absolute: 0 10*3/uL (ref 0.0–0.5)
Eosinophils Relative: 0 %
HCT: 45.4 % (ref 36.0–46.0)
Hemoglobin: 15.3 g/dL — ABNORMAL HIGH (ref 12.0–15.0)
Immature Granulocytes: 1 %
Lymphocytes Relative: 11 %
Lymphs Abs: 2.2 10*3/uL (ref 0.7–4.0)
MCH: 30.4 pg (ref 26.0–34.0)
MCHC: 33.7 g/dL (ref 30.0–36.0)
MCV: 90.1 fL (ref 80.0–100.0)
Monocytes Absolute: 1 10*3/uL (ref 0.1–1.0)
Monocytes Relative: 5 %
Neutro Abs: 16.3 10*3/uL — ABNORMAL HIGH (ref 1.7–7.7)
Neutrophils Relative %: 83 %
Platelet Count: 285 10*3/uL (ref 150–400)
RBC: 5.04 MIL/uL (ref 3.87–5.11)
RDW: 11.9 % (ref 11.5–15.5)
WBC Count: 19.7 10*3/uL — ABNORMAL HIGH (ref 4.0–10.5)
nRBC: 0 % (ref 0.0–0.2)

## 2023-07-28 LAB — FERRITIN: Ferritin: 24 ng/mL (ref 11–307)

## 2023-07-28 MED ORDER — SODIUM CHLORIDE 0.9 % IV SOLN
INTRAVENOUS | Status: DC
  Filled 2023-07-28 (×2): qty 250

## 2023-07-29 ENCOUNTER — Encounter: Payer: Self-pay | Admitting: Oncology

## 2023-07-31 ENCOUNTER — Other Ambulatory Visit: Payer: BC Managed Care – PPO

## 2023-09-11 ENCOUNTER — Other Ambulatory Visit: Payer: Self-pay | Admitting: *Deleted

## 2023-09-11 DIAGNOSIS — D751 Secondary polycythemia: Secondary | ICD-10-CM

## 2023-09-14 ENCOUNTER — Inpatient Hospital Stay: Payer: BC Managed Care – PPO

## 2023-09-14 ENCOUNTER — Encounter: Payer: Self-pay | Admitting: Oncology

## 2023-09-14 ENCOUNTER — Telehealth: Payer: Self-pay | Admitting: Physician Assistant

## 2023-09-14 NOTE — Telephone Encounter (Signed)
Patient is asking if she needs labs today and again on 12/2? I told her I would call her back and let her know if they are needed. Please advise.  Thanks! \

## 2023-09-28 ENCOUNTER — Inpatient Hospital Stay: Payer: BC Managed Care – PPO | Attending: Oncology

## 2023-09-28 ENCOUNTER — Inpatient Hospital Stay: Payer: BC Managed Care – PPO

## 2023-09-28 DIAGNOSIS — D751 Secondary polycythemia: Secondary | ICD-10-CM | POA: Diagnosis present

## 2023-09-28 LAB — CBC WITH DIFFERENTIAL/PLATELET
Abs Immature Granulocytes: 0.02 10*3/uL (ref 0.00–0.07)
Basophils Absolute: 0.1 10*3/uL (ref 0.0–0.1)
Basophils Relative: 1 %
Eosinophils Absolute: 0.1 10*3/uL (ref 0.0–0.5)
Eosinophils Relative: 1 %
HCT: 45.7 % (ref 36.0–46.0)
Hemoglobin: 15.2 g/dL — ABNORMAL HIGH (ref 12.0–15.0)
Immature Granulocytes: 0 %
Lymphocytes Relative: 40 %
Lymphs Abs: 3.2 10*3/uL (ref 0.7–4.0)
MCH: 29.6 pg (ref 26.0–34.0)
MCHC: 33.3 g/dL (ref 30.0–36.0)
MCV: 89.1 fL (ref 80.0–100.0)
Monocytes Absolute: 0.7 10*3/uL (ref 0.1–1.0)
Monocytes Relative: 9 %
Neutro Abs: 4 10*3/uL (ref 1.7–7.7)
Neutrophils Relative %: 49 %
Platelets: 283 10*3/uL (ref 150–400)
RBC: 5.13 MIL/uL — ABNORMAL HIGH (ref 3.87–5.11)
RDW: 12.4 % (ref 11.5–15.5)
WBC: 8 10*3/uL (ref 4.0–10.5)
nRBC: 0 % (ref 0.0–0.2)

## 2023-09-28 LAB — IRON AND TIBC
Iron: 131 ug/dL (ref 28–170)
Saturation Ratios: 32 % — ABNORMAL HIGH (ref 10.4–31.8)
TIBC: 416 ug/dL (ref 250–450)
UIBC: 285 ug/dL

## 2023-09-28 LAB — FERRITIN: Ferritin: 17 ng/mL (ref 11–307)

## 2023-09-28 NOTE — Progress Notes (Signed)
Patient tolatered phlebotomy well today, performed in [facility] using 20g angiocath. 500cc removed as ordered if hgb < 15. Today hgb 15.2. No concerns/ symptoms voiced. Snack and po fluids accepted. Stable at discharge. Refused AVS .

## 2023-09-28 NOTE — Patient Instructions (Signed)

## 2023-10-02 ENCOUNTER — Other Ambulatory Visit: Payer: BC Managed Care – PPO

## 2023-10-26 ENCOUNTER — Encounter: Payer: Self-pay | Admitting: Oncology

## 2023-11-23 ENCOUNTER — Other Ambulatory Visit: Payer: Self-pay | Admitting: *Deleted

## 2023-11-23 ENCOUNTER — Encounter: Payer: Self-pay | Admitting: Oncology

## 2023-11-23 DIAGNOSIS — D751 Secondary polycythemia: Secondary | ICD-10-CM

## 2023-11-24 ENCOUNTER — Inpatient Hospital Stay: Payer: BC Managed Care – PPO | Attending: Oncology

## 2023-11-24 DIAGNOSIS — D751 Secondary polycythemia: Secondary | ICD-10-CM | POA: Diagnosis present

## 2023-11-24 LAB — CBC WITH DIFFERENTIAL/PLATELET
Abs Immature Granulocytes: 0.02 10*3/uL (ref 0.00–0.07)
Basophils Absolute: 0 10*3/uL (ref 0.0–0.1)
Basophils Relative: 1 %
Eosinophils Absolute: 0.1 10*3/uL (ref 0.0–0.5)
Eosinophils Relative: 1 %
HCT: 44.3 % (ref 36.0–46.0)
Hemoglobin: 14.6 g/dL (ref 12.0–15.0)
Immature Granulocytes: 0 %
Lymphocytes Relative: 36 %
Lymphs Abs: 2.5 10*3/uL (ref 0.7–4.0)
MCH: 29.1 pg (ref 26.0–34.0)
MCHC: 33 g/dL (ref 30.0–36.0)
MCV: 88.2 fL (ref 80.0–100.0)
Monocytes Absolute: 0.5 10*3/uL (ref 0.1–1.0)
Monocytes Relative: 8 %
Neutro Abs: 3.9 10*3/uL (ref 1.7–7.7)
Neutrophils Relative %: 54 %
Platelets: 281 10*3/uL (ref 150–400)
RBC: 5.02 MIL/uL (ref 3.87–5.11)
RDW: 12.8 % (ref 11.5–15.5)
WBC: 7.1 10*3/uL (ref 4.0–10.5)
nRBC: 0 % (ref 0.0–0.2)

## 2023-11-24 LAB — IRON AND TIBC
Iron: 101 ug/dL (ref 28–170)
Saturation Ratios: 25 % (ref 10.4–31.8)
TIBC: 407 ug/dL (ref 250–450)
UIBC: 306 ug/dL

## 2023-11-24 LAB — FERRITIN: Ferritin: 15 ng/mL (ref 11–307)

## 2023-12-11 ENCOUNTER — Other Ambulatory Visit: Payer: Self-pay | Admitting: *Deleted

## 2023-12-11 DIAGNOSIS — D751 Secondary polycythemia: Secondary | ICD-10-CM

## 2023-12-14 ENCOUNTER — Inpatient Hospital Stay: Payer: BC Managed Care – PPO

## 2023-12-15 ENCOUNTER — Inpatient Hospital Stay: Payer: BC Managed Care – PPO | Admitting: Oncology

## 2023-12-21 ENCOUNTER — Inpatient Hospital Stay: Payer: BC Managed Care – PPO | Attending: Oncology

## 2023-12-21 DIAGNOSIS — D751 Secondary polycythemia: Secondary | ICD-10-CM | POA: Insufficient documentation

## 2023-12-21 LAB — CBC WITH DIFFERENTIAL/PLATELET
Abs Immature Granulocytes: 0.03 10*3/uL (ref 0.00–0.07)
Basophils Absolute: 0 10*3/uL (ref 0.0–0.1)
Basophils Relative: 1 %
Eosinophils Absolute: 0.1 10*3/uL (ref 0.0–0.5)
Eosinophils Relative: 1 %
HCT: 45.9 % (ref 36.0–46.0)
Hemoglobin: 15.2 g/dL — ABNORMAL HIGH (ref 12.0–15.0)
Immature Granulocytes: 1 %
Lymphocytes Relative: 37 %
Lymphs Abs: 2.2 10*3/uL (ref 0.7–4.0)
MCH: 29.2 pg (ref 26.0–34.0)
MCHC: 33.1 g/dL (ref 30.0–36.0)
MCV: 88.3 fL (ref 80.0–100.0)
Monocytes Absolute: 0.5 10*3/uL (ref 0.1–1.0)
Monocytes Relative: 8 %
Neutro Abs: 3.1 10*3/uL (ref 1.7–7.7)
Neutrophils Relative %: 52 %
Platelets: 273 10*3/uL (ref 150–400)
RBC: 5.2 MIL/uL — ABNORMAL HIGH (ref 3.87–5.11)
RDW: 12.9 % (ref 11.5–15.5)
WBC: 6 10*3/uL (ref 4.0–10.5)
nRBC: 0 % (ref 0.0–0.2)

## 2023-12-21 LAB — IRON AND TIBC
Iron: 116 ug/dL (ref 28–170)
Saturation Ratios: 31 % (ref 10.4–31.8)
TIBC: 381 ug/dL (ref 250–450)
UIBC: 265 ug/dL

## 2023-12-21 LAB — FERRITIN: Ferritin: 14 ng/mL (ref 11–307)

## 2023-12-22 ENCOUNTER — Inpatient Hospital Stay (HOSPITAL_BASED_OUTPATIENT_CLINIC_OR_DEPARTMENT_OTHER): Payer: BC Managed Care – PPO | Admitting: Oncology

## 2023-12-22 ENCOUNTER — Other Ambulatory Visit: Payer: Self-pay | Admitting: *Deleted

## 2023-12-22 DIAGNOSIS — D751 Secondary polycythemia: Secondary | ICD-10-CM | POA: Diagnosis not present

## 2023-12-22 NOTE — Progress Notes (Signed)
 [facility]  Telephone:([phone]) [phone] Fax:(336) [phone]  ID: Cheryl Clark OB: [birth year]  MR#: [id number]  WUX#:[id number]  Patient Care Team: Dione Housekeeper, MD as PCP - General (Family Medicine) Jeralyn Ruths, MD as Consulting Physician (Oncology)  I connected with Cheryl Clark on 12/22/23 at 10:00 AM EST by video enabled telemedicine visit and verified that I am speaking with the correct person using two identifiers.   I discussed the limitations, risks, security and privacy concerns of performing an evaluation and management service by telemedicine and the availability of in-person appointments. I also discussed with the patient that there may be a patient responsible charge related to this service. The patient expressed understanding and agreed to proceed.   Other persons participating in the visit and their role in the encounter: Patient, MD.  Patient's location: Home. Provider's location: Clinic.  CHIEF COMPLAINT: Polycythemia, heterozygote for hemochromatosis.  INTERVAL HISTORY: Patient agreed to video-assisted telemedicine visit for further evaluation and discussion of her laboratory results.  Patient states she notices increased fatigue approaching the 60-month timeframe when she is due for phlebotomy.  She otherwise feels well.  She has no neurologic complaints.  She denies any recent fevers or illnesses.  She has no chest pain, shortness of breath, cough, or hemoptysis.  She denies any nausea, vomiting, constipation, or diarrhea.  She has no urinary complaints.  Patient offers no further specific complaints today.  REVIEW OF SYSTEMS:   Review of Systems  Constitutional:  Positive for malaise/fatigue. Negative for fever and weight loss.  Respiratory: Negative.  Negative for cough, hemoptysis and shortness of breath.   Cardiovascular: Negative.  Negative for chest pain and leg swelling.  Gastrointestinal: Negative.   Negative for abdominal pain.  Genitourinary: Negative.  Negative for dysuria.  Musculoskeletal: Negative.  Negative for back pain.  Skin: Negative.  Negative for rash.  Neurological: Negative.  Negative for dizziness, focal weakness, weakness and headaches.  Psychiatric/Behavioral: Negative.  The patient is not nervous/anxious.     As per HPI. Otherwise, a complete review of systems is negative.  PAST MEDICAL HISTORY: Past Medical History:  Diagnosis Date   Rheumatoid arthritis (HCC)    Thyroid disease     PAST SURGICAL HISTORY: Past Surgical History:  Procedure Laterality Date   ABDOMINAL HYSTERECTOMY     FRACTURE SURGERY     Left femur rod   REDUCTION MAMMAPLASTY Bilateral 11/2021   Collagen mesh implant per patient   VAGINAL PROLAPSE REPAIR      FAMILY HISTORY: Family History  Problem Relation Age of Onset   Breast cancer Maternal Grandmother    Breast cancer Paternal Grandmother     ADVANCED DIRECTIVES (Y/N):  N  HEALTH MAINTENANCE: Social History   Tobacco Use   Smoking status: Never   Smokeless tobacco: Never  Vaping Use   Vaping status: Never Used  Substance Use Topics   Alcohol use: No   Drug use: Never     Colonoscopy:  PAP:  Bone density:  Lipid panel:  Allergies  Allergen Reactions   Iodinated Contrast Media Anxiety, Itching, Nausea Only, Palpitations and Shortness Of Breath    Generalized, Nausea   Codeine Itching   Demerol [Meperidine] Itching   Macrobid [Nitrofurantoin Monohyd Macro] Hives   Sulfa Antibiotics Hives    Current Outpatient Medications  Medication Sig Dispense Refill   ADDERALL XR 20 MG 24 hr capsule Take 20 mg by mouth every morning.     ALPRAZolam (XANAX) 0.25 MG tablet Take  0.25 mg by mouth daily as needed.     celecoxib (CELEBREX) 100 MG capsule Take 100 mg by mouth 2 (two) times daily.     hydrOXYzine (ATARAX) 25 MG tablet Take 1 tablet by mouth 3 (three) times daily as needed.     Multiple Vitamin (MULTI-VITAMIN)  tablet Take 1 tablet by mouth daily.     omeprazole (PRILOSEC) 40 MG capsule Take 40 mg by mouth daily.     ondansetron (ZOFRAN-ODT) 4 MG disintegrating tablet Take 4 mg by mouth every 8 (eight) hours as needed.     tirzepatide (MOUNJARO) 7.5 MG/0.5ML Pen Inject 7.5 mg into the skin once a week. (Patient not taking: Reported on 06/19/2023)     traZODone (DESYREL) 50 MG tablet Take 1 tablet by mouth at bedtime as needed.     No current facility-administered medications for this visit.    OBJECTIVE: There were no vitals filed for this visit.     There is no height or weight on file to calculate BMI.    ECOG FS:0 - Asymptomatic  General: Well-developed, well-nourished, no acute distress. HEENT: Normocephalic. Neuro: Alert, answering all questions appropriately. Cranial nerves grossly intact. Psych: Normal affect.   LAB RESULTS:  Lab Results  Component Value Date   NA 137 07/19/2021   K 4.3 07/19/2021   CL 104 07/19/2021   CO2 26 07/19/2021   GLUCOSE 94 07/19/2021   BUN 22 (H) 07/19/2021   CREATININE 0.88 07/19/2021   CALCIUM 9.0 07/19/2021   GFRNONAA >60 07/19/2021   GFRAA >60 06/09/2015    Lab Results  Component Value Date   WBC 6.0 12/21/2023   NEUTROABS 3.1 12/21/2023   HGB 15.2 (H) 12/21/2023   HCT 45.9 12/21/2023   MCV 88.3 12/21/2023   PLT 273 12/21/2023   Lab Results  Component Value Date   IRON 116 12/21/2023   TIBC 381 12/21/2023   IRONPCTSAT 31 12/21/2023   Lab Results  Component Value Date   FERRITIN 14 12/21/2023     STUDIES: No results found.  ASSESSMENT: Polycythemia, heterozygote for hemochromatosis.  PLAN:    Polycythemia: Patient reports that she feels improved and has less fatigue when her hemoglobin is below 15.0.  Today's result is 15.2.  Previously, all of her other laboratory work including iron panel, erythropoietin levels, JAK2 mutation with reflex are all negative or within normal limits.  Proceed with 500 mL phlebotomy later this  week.   Return to clinic in 3 months for laboratory work and phlebotomy if her hemoglobin is greater than 15 and then in 6 months for laboratory work and video-assisted telemedicine visit.   Heterozygote hemochromatosis: Likely clinically insignificant with ferritin of 14 and iron saturation ratio of 31%.  Phlebotomy as above.  I provided 20 minutes of face-to-face video visit time during this encounter which included chart review, counseling, and coordination of care as documented above.  Patient expressed understanding and was in agreement with this plan. She also understands that She can call clinic at any time with any questions, concerns, or complaints.     Jeralyn Ruths, MD   12/22/2023 10:20 AM

## 2023-12-25 ENCOUNTER — Inpatient Hospital Stay: Payer: BC Managed Care – PPO

## 2023-12-25 DIAGNOSIS — D751 Secondary polycythemia: Secondary | ICD-10-CM | POA: Diagnosis not present

## 2023-12-25 NOTE — Patient Instructions (Signed)

## 2023-12-25 NOTE — Progress Notes (Signed)
 Patient tolatered phlebotomy well today, performed in LAC using 20g angiocath. 500 cc removed as ordered. Hgb 15.2 today. No concerns voiced. Snack and po fluids offered. Stable at discharge. Refused AVS

## 2024-01-20 ENCOUNTER — Ambulatory Visit: Admitting: Certified Nurse Midwife

## 2024-01-20 ENCOUNTER — Encounter: Payer: Self-pay | Admitting: Certified Nurse Midwife

## 2024-01-20 VITALS — BP 106/73 | HR 78 | Ht 69.0 in | Wt 199.6 lb

## 2024-01-20 DIAGNOSIS — R300 Dysuria: Secondary | ICD-10-CM

## 2024-01-20 DIAGNOSIS — N959 Unspecified menopausal and perimenopausal disorder: Secondary | ICD-10-CM | POA: Diagnosis not present

## 2024-01-20 LAB — POCT URINALYSIS DIPSTICK
Bilirubin, UA: NEGATIVE
Blood, UA: NEGATIVE
Glucose, UA: NEGATIVE
Ketones, UA: NEGATIVE
Leukocytes, UA: NEGATIVE
Nitrite, UA: NEGATIVE
Protein, UA: NEGATIVE
Spec Grav, UA: 1.025 (ref 1.010–1.025)
Urobilinogen, UA: 0.2 U/dL
pH, UA: 5 (ref 5.0–8.0)

## 2024-01-20 MED ORDER — ESTRADIOL 0.1 MG/GM VA CREA: 1.0000 | TOPICAL_CREAM | VAGINAL | 12 refills | Status: AC

## 2024-01-20 MED ORDER — ZOLPIDEM TARTRATE ER 6.25 MG PO TBCR: 6.2500 mg | EXTENDED_RELEASE_TABLET | Freq: Every evening | ORAL | 3 refills | Status: DC | PRN

## 2024-01-20 NOTE — Progress Notes (Signed)
 GYN ENCOUNTER NOTE  Subjective:       Cheryl Clark is a 58 y.o. [phone] female is here for gynecologic evaluation of the following issues:  1. Menopausal symptoms including: low libido, mood changes, urinary burning insomnia, itching , phantom pain( arm,leg ect).  She notes panic attacks waking her 3-4 times a week.    Gynecologic History No LMP recorded. Patient has had a hysterectomy. Postmenopausal Contraception: status post hysterectomy Last Pap: n/a .  Last mammogram: 02/13/2023. Results were: normal  Obstetric History OB History  Gravida Para Term Preterm AB Living  3 2 1 1 1 2   SAB IAB Ectopic Multiple Live Births  1    2    # Outcome Date GA Lbr Len/2nd Weight Sex Type Anes PTL Lv  3 Term 09/13/01   9 lb (4.082 kg) M Vag-Spont  N LIV  2 Preterm 06/18/98   5 lb 9 oz (2.523 kg) M Vag-Spont  Y LIV  1 SAB 10/1995            Past Medical History:  Diagnosis Date   Rheumatoid arthritis (HCC)    Thyroid disease     Past Surgical History:  Procedure Laterality Date   ABDOMINAL HYSTERECTOMY     FRACTURE SURGERY     Left femur rod   REDUCTION MAMMAPLASTY Bilateral 11/2021   Collagen mesh implant per patient   VAGINAL PROLAPSE REPAIR      Current Outpatient Medications on File Prior to Visit  Medication Sig Dispense Refill   amphetamine-dextroamphetamine (ADDERALL) 10 MG tablet Take by mouth.     hydrOXYzine (ATARAX) 25 MG tablet Take 1 tablet by mouth 3 (three) times daily as needed.     Lancets (ONETOUCH DELICA PLUS LANCET33G) MISC      Multiple Vitamin (MULTI-VITAMIN) tablet Take 1 tablet by mouth daily.     omeprazole (PRILOSEC) 40 MG capsule Take 40 mg by mouth daily.     ondansetron (ZOFRAN-ODT) 4 MG disintegrating tablet Take 4 mg by mouth every 8 (eight) hours as needed.     traZODone (DESYREL) 50 MG tablet Take 1 tablet by mouth at bedtime as needed.     ZEPBOUND 7.5 MG/0.5ML Pen SMARTSIG:0.5 Milliliter(s) SUB-Q Once a Week     buPROPion  (WELLBUTRIN XL) 150 MG 24 hr tablet Take by mouth. (Patient not taking: Reported on 01/20/2024)     naltrexone (DEPADE) 50 MG tablet Take 1 tablet by mouth daily. (Patient not taking: Reported on 01/20/2024)     ZEPBOUND 10 MG/0.5ML Pen Inject into the skin. (Patient not taking: Reported on 01/20/2024)     No current facility-administered medications on file prior to visit.    Allergies  Allergen Reactions   Iodinated Contrast Media Anxiety, Itching, Nausea Only, Palpitations and Shortness Of Breath    Generalized, Nausea   Codeine Itching   Demerol [Meperidine] Itching   Macrobid [Nitrofurantoin Monohyd Macro] Hives   Sulfa Antibiotics Hives    Social History   Socioeconomic History   Marital status: Married    Spouse name: Not on file   Number of children: Not on file   Years of education: Not on file   Highest education level: Not on file  Occupational History   Not on file  Tobacco Use   Smoking status: Never   Smokeless tobacco: Never  Vaping Use   Vaping status: Never Used  Substance and Sexual Activity   Alcohol use: No   Drug use: Never   Sexual activity:  Yes    Birth control/protection: Surgical  Other Topics Concern   Not on file  Social History Narrative   Not on file   Social Drivers of Health   Financial Resource Strain: Low Risk  (08/25/2023)   Received from [facility]   Overall Financial Resource Strain (CARDIA)    Difficulty of Paying Living Expenses: Not hard at all  Food Insecurity: No Food Insecurity (08/25/2023)   Received from [facility]   Hunger Vital Sign    Worried About Running Out of Food in the Last Year: Never true    Ran Out of Food in the Last Year: Never true  Transportation Needs: No Transportation Needs (08/25/2023)   Received from [facility] - Transportation    In the past 12 months, has lack of transportation kept you from medical appointments or from getting  medications?: No    Lack of Transportation (Non-Medical): No  Physical Activity: Not on file  Stress: Not on file  Social Connections: Not on file  Intimate Partner Violence: Not on file    Family History  Problem Relation Age of Onset   Breast cancer Maternal Grandmother    Breast cancer Paternal Grandmother     The following portions of the patient's history were reviewed and updated as appropriate: allergies, current medications, past family history, past medical history, past social history, past surgical history and problem list.  Review of Systems Review of Systems - Negative except as mentioned in HPI  Review of Systems - General ROS: negative for - chills, fatigue, fever, hot flashes, malaise or night sweats Hematological and Lymphatic ROS: negative for - bleeding problems or swollen lymph nodes Gastrointestinal ROS: negative for - abdominal pain, blood in stools, change in bowel habits and nausea/vomiting Musculoskeletal ROS: negative for - joint pain, muscle pain or muscular weakness Genito-Urinary ROS: negative for - change in menstrual cycle, dysmenorrhea, dyspareunia, dysuria, genital discharge, genital ulcers, hematuria, incontinence, irregular/heavy menses, nocturia or pelvic painjj  Objective:   BP 106/73   Pulse 78   Ht 5\' 9"  (1.753 m)   Wt 199 lb 9.6 oz (90.5 kg)   BMI 29.48 kg/m  CONSTITUTIONAL: Well-developed, well-nourished female in no acute distress.  HENT:  Normocephalic, atraumatic.  NECK: Normal range of motion, supple, no masses.  Normal thyroid.  SKIN: Skin is warm and dry. No rash noted. Not diaphoretic. No erythema. No pallor. NEUROLGIC: Alert and oriented to person, place, and time. PSYCHIATRIC: Normal mood and affect. Normal behavior. Normal judgment and thought content. CARDIOVASCULAR:Not Examined RESPIRATORY: Not Examined BREASTS: Not Examined ABDOMEN: Soft, non distended; Non tender.  No Organomegaly. PELVIC: deferred MUSCULOSKELETAL:  Normal range of motion. No tenderness.  No cyanosis, clubbing, or edema.     Assessment:   1. Postmenopausal symptoms (Primary)    Plan:   Discussed importance of fluid hydration, moisturizing skin regularly, use of gentle skin care products, avoiding skin contact of irritating fabrics, oatmeal baths , and use of antihistamine .  Discussed HRT use for mood, low libido , itching. However, pt has polycythemia which increases risks for clotting. Recommend avoidance of systemic estrogen given risks. She agrees.   She has trazodone ordered to help her sleep but notes she feels hung over after use. Will discontinue trazodone and order Ambien. She has tried melatonin that did not work for her. She has vistaril ordered. Discussed use at night with anxiety attack as well for itching. She will try the  hydroxyzine for the panic attack. TSH /T4 , and Testosterone ordered. Will follow up with lab results.   Dr. Valentino Saxon consulted on Estrogen use for this patient and she is in agreement.   Doreene Burke, CNM

## 2024-01-20 NOTE — Progress Notes (Signed)
 Error

## 2024-01-20 NOTE — Patient Instructions (Signed)
 Menopause and HRT (Hormone Replacement Therapy): What to Expect Menopause is the time in your life when your menstrual period stops, and your ovaries stop making certain hormones. These hormones are called estrogen and progesterone. Low levels of these hormones can affect your health and cause symptoms. Hormone replacement therapy (HRT) is a treatment that replaces the estrogen and progesterone in your body. Types of HRT  There are many types and doses of HRT. Talk with your health care provider about what form of HRT is best for you. HRT may include: Estrogen and progestin. This is more common if you have a uterus. Estrogen-only therapy. This is used if you don't have a uterus. You may be given the HRT as: Pills. Patches. Gels. Sprays. A vaginal cream. Vaginal rings. Vaginal inserts. How many hormones you need to take and how long you should take them depend on your health. You should: Start HRT at the lowest possible dose. Stop HRT as soon as you're told to. Work with your provider so you feel informed and comfortable with what happens. Tell a health care provider about: Any allergies you have. Whether you've had blood clots or are at risk for blood clots. Whether you or family members have had cancer, such as cancer of: The breasts. The ovaries. The uterus. Any surgeries you've had. Any medical problems you have. All medicines you take. These include vitamins, herbs, eye drops, and creams. Whether you're pregnant or may be pregnant. What are the benefits? HRT can help with the symptoms of menopause. The benefits depend on: What symptoms you have. How bad your symptoms are. Your overall health. Symptoms of menopause that HRT can help with include: Hot flashes and night sweats. Hot flashes are sudden feelings of heat that spread over your face and body. Your skin may turn red, like a blush. Night sweats are hot flashes that happen when you're sleeping or trying to  sleep. Bone loss. The body loses calcium faster after menopause. This can cause your bones to be weaker. They may be more likely to break. Vaginal dryness. The lining of the vagina can get thin and dry, which can cause: Pain during sex. Infection. Burning. Itching. Urinary tract infections. Not being able to control when you pee. Irritability, which means getting annoyed easily. Short-term memory problems. What are the risks? Risks depend on your health and medical history. They also depend on if you get estrogen and progestin or estrogen-only therapy. HRT may make you more likely to have: Spotting. This is when a small amount of blood leaks from your vagina when you don't expect it to. Endometrial cancer. This is cancer of the lining of the uterus. Breast cancer. Higher density of breast tissue. This can make it harder to find breast cancer on an X-ray. A stroke. Heart disease. Blood clots. Gallbladder or liver disease. You may be more at risk for problems if you have: Endometrial cancer. Liver disease. Heart disease. Breast cancer. A history of blood clots. A history of stroke. Follow these instructions at home: Take your medicines only as told. Do not smoke, vape, or use nicotine or tobacco. Go to your provider as often as told to get: Mammograms. Pelvic exams. Medical checkups. Contact a health care provider if: Your legs hurt or swell. You feel lumps or changes in your breasts or armpits. You feel pain, burning, or bleeding when you pee. You have bleeding from your vagina that's not normal. You feel dizzy or get headaches. You have pain in your belly.  Get help right away if: You have shortness of breath. You have chest pain. You have slurred speech. Any part of your arms or legs feels weak or numb. These symptoms may be an emergency. Call 911 right away. Do not wait to see if the symptoms will go away. Do not drive yourself to the hospital. This information is  not intended to replace advice given to you by your health care provider. Make sure you discuss any questions you have with your health care provider. Document Revised: 06/12/2023 Document Reviewed: 06/12/2023 Elsevier Patient Education  2024 [employer].

## 2024-01-25 ENCOUNTER — Encounter: Payer: Self-pay | Admitting: Certified Nurse Midwife

## 2024-01-25 ENCOUNTER — Other Ambulatory Visit: Payer: Self-pay | Admitting: Certified Nurse Midwife

## 2024-01-25 DIAGNOSIS — B379 Candidiasis, unspecified: Secondary | ICD-10-CM

## 2024-01-25 LAB — TSH+FREE T4
Free T4: 1.41 ng/dL (ref 0.82–1.77)
TSH: 0.839 u[IU]/mL (ref 0.450–4.500)

## 2024-01-25 LAB — TESTOSTERONE, FREE, TOTAL, SHBG
Sex Hormone Binding: 58.9 nmol/L (ref 17.3–125.0)
Testosterone, Free: 1.8 pg/mL (ref 0.0–4.2)
Testosterone: 22 ng/dL (ref 4–50)

## 2024-01-25 LAB — URINE CULTURE

## 2024-01-25 MED ORDER — AMOXICILLIN-POT CLAVULANATE 875-125 MG PO TABS: 1.0000 | ORAL_TABLET | Freq: Every day | ORAL | 0 refills | Status: AC

## 2024-01-25 MED ORDER — FLUCONAZOLE 150 MG PO TABS: 150.0000 mg | ORAL_TABLET | Freq: Every day | ORAL | 1 refills | Status: DC

## 2024-01-28 NOTE — Telephone Encounter (Signed)
 Please advise pt on her anxiety and tsh results.

## 2024-02-01 ENCOUNTER — Other Ambulatory Visit: Payer: Self-pay | Admitting: Certified Nurse Midwife

## 2024-02-01 ENCOUNTER — Encounter: Payer: Self-pay | Admitting: Certified Nurse Midwife

## 2024-02-01 MED ORDER — LEVOTHYROXINE SODIUM 25 MCG PO TABS: 25.0000 ug | ORAL_TABLET | Freq: Every day | ORAL | 6 refills | Status: AC

## 2024-02-17 ENCOUNTER — Ambulatory Visit (INDEPENDENT_AMBULATORY_CARE_PROVIDER_SITE_OTHER)

## 2024-02-17 ENCOUNTER — Other Ambulatory Visit: Payer: Self-pay | Admitting: Certified Nurse Midwife

## 2024-02-17 ENCOUNTER — Encounter: Payer: Self-pay | Admitting: Certified Nurse Midwife

## 2024-02-17 VITALS — BP 122/77 | HR 76 | Ht 69.0 in | Wt 205.5 lb

## 2024-02-17 DIAGNOSIS — R300 Dysuria: Secondary | ICD-10-CM

## 2024-02-17 LAB — POCT URINALYSIS DIPSTICK
Bilirubin, UA: NEGATIVE
Blood, UA: NEGATIVE
Glucose, UA: NEGATIVE
Ketones, UA: NEGATIVE
Leukocytes, UA: NEGATIVE
Nitrite, UA: NEGATIVE
Protein, UA: NEGATIVE
Spec Grav, UA: 1.015 (ref 1.010–1.025)
Urobilinogen, UA: 0.2 U/dL
pH, UA: 6 (ref 5.0–8.0)

## 2024-02-17 MED ORDER — CIPROFLOXACIN HCL 500 MG PO TABS: 500.0000 mg | ORAL_TABLET | Freq: Two times a day (BID) | ORAL | 0 refills | Status: AC

## 2024-02-17 NOTE — Progress Notes (Cosign Needed Addendum)
    NURSE VISIT NOTE  Subjective:    Patient ID: Cheryl Clark, female    DOB: 1966-01-30, 58 y.o.   MRN: [id number]       HPI  Patient is a 58 y.o. [id number] female who presents for dysuria and pelvic pressure  for 1 week.  Patient denies hematuria, genital rash, genital irritation, and vaginal discharge.  Patient does not have a history of recurrent UTI.  Patient does not have a history of pyelonephritis.    Objective:    BP 122/77   Pulse 76   Ht 5\' 9"  (1.753 m)   Wt 205 lb 8 oz (93.2 kg)   BMI 30.35 kg/m    Lab Review  Results for orders placed or performed in visit on 02/17/24  POCT urinalysis dipstick  Result Value Ref Range   Color, UA yellow    Clarity, UA clear    Glucose, UA Negative Negative   Bilirubin, UA negative    Ketones, UA negative    Spec Grav, UA 1.015 1.010 - 1.025   Blood, UA negative    pH, UA 6.0 5.0 - 8.0   Protein, UA Negative Negative   Urobilinogen, UA 0.2 0.2 or 1.0 E.U./dL   Nitrite, UA negative    Leukocytes, UA Negative Negative   Appearance     Odor      Assessment:   1. Dysuria      Plan:   Urine Culture Sent. Maintain adequate hydration.  May use AZO OTC prn.  Follow up if symptoms worsen or fail to improve as anticipated, and as needed.    Inga Manges, CMA

## 2024-02-17 NOTE — Patient Instructions (Signed)
 Dysuria Dysuria is pain or discomfort when you pee. The pain may be felt in your urethra, which is the part of your body that drains pee (urine) from your bladder. The pain may also be felt near your genitals, groin, or in your lower belly or back. You may have to pee often or have the sudden feeling that you need to pee. This condition can affect anyone, but it's more common in females. It can be caused by: A urinary tract infection (UTI). Kidney stones or bladder stones. Some sexually transmitted infections (STIs). Dehydration. This is when there's not enough water in your body. Irritation and swelling in the vagina. The use of some medicines. The use of some soaps or products with a scent. Follow these instructions at home: Medicines  Take your medicines only as told. Take your antibiotics as told. Do not stop taking them even if you start to feel better. Eating and drinking Drink enough fluid to keep your pee pale yellow. Certain drinks can make the pain worse. Avoid: Drinks with caffeine in them. Tea. Alcohol. In males, alcohol may irritate the prostate. General instructions Watch your condition for any changes, such as color changes in your pee. Pee often. Do not hold your pee for a long time. If you're female, wipe from front to back after you pee or poop. Use each tissue only once when you wipe. Pee after you have sex. If you've had any tests done, it's up to you to get your test results. Ask your health care provider, or the department doing the test, when your results will be ready. Contact a health care provider if: You have a fever or chills. You have pain in your back or sides. You throw up or feel like you may throw up. You have blood in your pee. You're not peeing as often as normal. You feel very weak. Get help right away if: You have very bad pain that doesn't get better with medicine. You're confused. You have a fast heartbeat while resting. This information is  not intended to replace advice given to you by your health care provider. Make sure you discuss any questions you have with your health care provider. Document Revised: 02/17/2023 Document Reviewed: 02/17/2023 Elsevier Patient Education  2024 [employer].

## 2024-02-18 LAB — URINALYSIS, ROUTINE W REFLEX MICROSCOPIC
Bilirubin, UA: NEGATIVE
Glucose, UA: NEGATIVE
Ketones, UA: NEGATIVE
Leukocytes,UA: NEGATIVE
Nitrite, UA: NEGATIVE
Protein,UA: NEGATIVE
RBC, UA: NEGATIVE
Specific Gravity, UA: 1.02 (ref 1.005–1.030)
Urobilinogen, Ur: 0.2 mg/dL (ref 0.2–1.0)
pH, UA: 6 (ref 5.0–7.5)

## 2024-02-21 ENCOUNTER — Other Ambulatory Visit: Payer: Self-pay | Admitting: Certified Nurse Midwife

## 2024-02-21 ENCOUNTER — Encounter: Payer: Self-pay | Admitting: Certified Nurse Midwife

## 2024-02-21 LAB — URINE CULTURE

## 2024-02-26 ENCOUNTER — Other Ambulatory Visit: Payer: Self-pay

## 2024-02-26 DIAGNOSIS — B379 Candidiasis, unspecified: Secondary | ICD-10-CM

## 2024-02-26 MED ORDER — FLUCONAZOLE 150 MG PO TABS
150.0000 mg | ORAL_TABLET | Freq: Every day | ORAL | 1 refills | Status: AC
Start: 2024-02-26 — End: ?

## 2024-03-07 ENCOUNTER — Encounter: Payer: Self-pay | Admitting: Oncology

## 2024-03-09 ENCOUNTER — Inpatient Hospital Stay

## 2024-03-09 ENCOUNTER — Encounter

## 2024-03-09 ENCOUNTER — Inpatient Hospital Stay: Attending: Oncology

## 2024-03-09 VITALS — BP 109/74 | HR 73 | Temp 98.4°F | Resp 18

## 2024-03-09 DIAGNOSIS — D751 Secondary polycythemia: Secondary | ICD-10-CM | POA: Diagnosis present

## 2024-03-09 LAB — CBC WITH DIFFERENTIAL/PLATELET
Abs Immature Granulocytes: 0.04 10*3/uL (ref 0.00–0.07)
Basophils Absolute: 0.1 10*3/uL (ref 0.0–0.1)
Basophils Relative: 1 %
Eosinophils Absolute: 0.1 10*3/uL (ref 0.0–0.5)
Eosinophils Relative: 1 %
HCT: 45.8 % (ref 36.0–46.0)
Hemoglobin: 15.4 g/dL — ABNORMAL HIGH (ref 12.0–15.0)
Immature Granulocytes: 1 %
Lymphocytes Relative: 39 %
Lymphs Abs: 2.9 10*3/uL (ref 0.7–4.0)
MCH: 29.3 pg (ref 26.0–34.0)
MCHC: 33.6 g/dL (ref 30.0–36.0)
MCV: 87.2 fL (ref 80.0–100.0)
Monocytes Absolute: 0.5 10*3/uL (ref 0.1–1.0)
Monocytes Relative: 7 %
Neutro Abs: 4 10*3/uL (ref 1.7–7.7)
Neutrophils Relative %: 51 %
Platelets: 294 10*3/uL (ref 150–400)
RBC: 5.25 MIL/uL — ABNORMAL HIGH (ref 3.87–5.11)
RDW: 12.7 % (ref 11.5–15.5)
WBC: 7.6 10*3/uL (ref 4.0–10.5)
nRBC: 0 % (ref 0.0–0.2)

## 2024-03-09 LAB — FERRITIN: Ferritin: 8 ng/mL — ABNORMAL LOW (ref 11–307)

## 2024-03-09 LAB — IRON AND TIBC
Iron: 89 ug/dL (ref 28–170)
Saturation Ratios: 21 % (ref 10.4–31.8)
TIBC: 431 ug/dL (ref 250–450)
UIBC: 342 ug/dL

## 2024-03-09 MED ORDER — SODIUM CHLORIDE 0.9 % IV SOLN
Freq: Once | INTRAVENOUS | Status: AC
  Filled 2024-03-09: qty 250

## 2024-03-09 MED ORDER — SODIUM CHLORIDE 0.9 % IV SOLN
Freq: Once | INTRAVENOUS | Status: DC
  Filled 2024-03-09: qty 250

## 2024-03-09 NOTE — Progress Notes (Signed)
 Cheryl Clark presents today for phlebotomy per MD orders. Phlebotomy procedure started at 1325 and ended at 1340. 500 mls removed. Patient tolerated procedure well. IV needle removed intact.

## 2024-03-09 NOTE — Patient Instructions (Signed)

## 2024-03-10 ENCOUNTER — Encounter: Payer: Self-pay | Admitting: Oncology

## 2024-03-16 ENCOUNTER — Emergency Department
Admission: EM | Admit: 2024-03-16 | Discharge: 2024-03-16 | Disposition: A | Discharge status: Home / Self Care | Attending: Emergency Medicine | Admitting: Emergency Medicine

## 2024-03-16 ENCOUNTER — Other Ambulatory Visit: Payer: Self-pay

## 2024-03-16 ENCOUNTER — Emergency Department

## 2024-03-16 DIAGNOSIS — R197 Diarrhea, unspecified: Secondary | ICD-10-CM | POA: Diagnosis not present

## 2024-03-16 DIAGNOSIS — K5900 Constipation, unspecified: Secondary | ICD-10-CM

## 2024-03-16 DIAGNOSIS — R109 Unspecified abdominal pain: Secondary | ICD-10-CM | POA: Insufficient documentation

## 2024-03-16 LAB — COMPREHENSIVE METABOLIC PANEL WITH GFR
ALT: 18 U/L (ref 0–44)
AST: 23 U/L (ref 15–41)
Albumin: 3.8 g/dL (ref 3.5–5.0)
Alkaline Phosphatase: 55 U/L (ref 38–126)
Anion gap: 6 (ref 5–15)
BUN: 11 mg/dL (ref 6–20)
CO2: 25 mmol/L (ref 22–32)
Calcium: 9.5 mg/dL (ref 8.9–10.3)
Chloride: 110 mmol/L (ref 98–111)
Creatinine, Ser: 0.86 mg/dL (ref 0.44–1.00)
GFR, Estimated: >60 mL/min (ref >60)
Glucose, Bld: 100 mg/dL — ABNORMAL HIGH (ref 70–99)
Potassium: 4 mmol/L (ref 3.5–5.1)
Sodium: 141 mmol/L (ref 135–145)
Total Bilirubin: 0.9 mg/dL (ref 0.0–1.2)
Total Protein: 6.5 g/dL (ref 6.5–8.1)

## 2024-03-16 LAB — CBC
HCT: 38.4 % (ref 36.0–46.0)
Hemoglobin: 12.9 g/dL (ref 12.0–15.0)
MCH: 29.5 pg (ref 26.0–34.0)
MCHC: 33.6 g/dL (ref 30.0–36.0)
MCV: 87.9 fL (ref 80.0–100.0)
Platelets: 290 10*3/uL (ref 150–400)
RBC: 4.37 MIL/uL (ref 3.87–5.11)
RDW: 12.8 % (ref 11.5–15.5)
WBC: 6.1 10*3/uL (ref 4.0–10.5)
nRBC: 0 % (ref 0.0–0.2)

## 2024-03-16 LAB — URINALYSIS, ROUTINE W REFLEX MICROSCOPIC
Bilirubin Urine: NEGATIVE
Glucose, UA: NEGATIVE mg/dL
Hgb urine dipstick: NEGATIVE
Ketones, ur: NEGATIVE mg/dL
Leukocytes,Ua: NEGATIVE
Nitrite: NEGATIVE
Protein, ur: NEGATIVE mg/dL
Specific Gravity, Urine: 1.024 (ref 1.005–1.030)
pH: 5 (ref 5.0–8.0)

## 2024-03-16 LAB — LIPASE, BLOOD: Lipase: 31 U/L (ref 11–51)

## 2024-03-16 MED ORDER — DIPHENHYDRAMINE HCL 25 MG PO CAPS
25.0000 mg | ORAL_CAPSULE | Freq: Once | ORAL | Status: AC
  Administered 2024-03-16: 25 mg via ORAL
  Filled 2024-03-16: qty 1

## 2024-03-16 MED ORDER — DOCUSATE SODIUM 100 MG PO CAPS: 100.0000 mg | ORAL_CAPSULE | Freq: Two times a day (BID) | ORAL | 5 refills | Status: AC

## 2024-03-16 MED ORDER — HYDROCODONE-ACETAMINOPHEN 5-325 MG PO TABS
1.0000 | ORAL_TABLET | Freq: Once | ORAL | Status: AC
  Administered 2024-03-16: 1 via ORAL
  Filled 2024-03-16: qty 1

## 2024-03-16 MED ORDER — POLYETHYLENE GLYCOL 3350 17 GM/SCOOP PO POWD: 17.0000 g | Freq: Two times a day (BID) | ORAL | 0 refills | Status: AC | PRN

## 2024-03-16 MED ORDER — ONDANSETRON 4 MG PO TBDP
4.0000 mg | ORAL_TABLET | Freq: Once | ORAL | Status: AC
  Administered 2024-03-16: 4 mg via ORAL
  Filled 2024-03-16: qty 1

## 2024-03-16 NOTE — ED Provider Notes (Signed)
 [facility] Provider Note    Event Date/Time   First MD Initiated Contact with Patient 03/16/24 [phone]     (approximate)  History   Chief Complaint: Abdominal Pain  HPI  Cheryl Clark is a 58 y.o. female who presents to the emergency department for right-sided abdominal pain.  According to the patient over the past 5 days now she has been experiencing pain intermittently in her right side.  Patient also states over the same time.  She has been experiencing diarrhea and nausea.  No vomiting.  No fever.  No urinary symptoms.  Patient denies any clear association with food.  Patient states she was having significant pain last night along with diarrhea so she came to the emergency department today for evaluation.  Physical Exam   Triage Vital Signs: ED Triage Vitals  Encounter Vitals Group     BP 03/16/24 0832 125/75     Systolic BP Percentile --      Diastolic BP Percentile --      Pulse Rate 03/16/24 0832 73     Resp 03/16/24 0832 16     Temp 03/16/24 0832 98.3 F (36.8 C)     Temp Source 03/16/24 0832 Oral     SpO2 03/16/24 0832 100 %     Weight 03/16/24 0831 170 lb (77.1 kg)     Height 03/16/24 0831 5\' 9"  (1.753 m)     Head Circumference --      Peak Flow --      Pain Score 03/16/24 0831 5     Pain Loc --      Pain Education --      Exclude from Growth Chart --     Most recent vital signs: Vitals:   03/16/24 0832  BP: 125/75  Pulse: 73  Resp: 16  Temp: 98.3 F (36.8 C)  SpO2: 100%    General: Awake, no distress.  CV:  Good peripheral perfusion.  Regular rate and rhythm  Resp:  Normal effort.  Equal breath sounds bilaterally.  Abd:  No distention.  Soft, mild tenderness to palpation along the right upper to right lower quadrant.  No rebound or guarding.  No left-sided tenderness.  ED Results / Procedures / Treatments   RADIOLOGY  I have reviewed and interpreted CT images.  No obvious obstruction or significant  abnormalities in my evaluation.  Patient does appear to have stool in the right colon which could be contributing to the patient's pain. Radiology is read the CT scan as negative for acute abnormality.   MEDICATIONS ORDERED IN ED: Medications  HYDROcodone-acetaminophen  (NORCO/VICODIN) 5-325 MG per tablet 1 tablet (has no administration in time range)  ondansetron (ZOFRAN-ODT) disintegrating tablet 4 mg (has no administration in time range)     IMPRESSION / MDM / ASSESSMENT AND PLAN / ED COURSE  I reviewed the triage vital signs and the nursing notes.  Patient's presentation is most consistent with acute presentation with potential threat to life or bodily function.  Patient presents emergency department for abdominal pain mostly on the right side ongoing for the past 5 or 6 days associate with diarrhea.  Overall the patient appears well.  No distress.  Lab work has resulted showing normal results with a normal CBC and normal white blood cell count reassuring chemistry with normal LFTs and a normal lipase.  Urinalysis is pending.  Given the patient's tenderness to palpation on the right side of her abdomen we will obtain CT imaging of  the abdomen and pelvis without contrast (due to allergy) to further evaluate.  Patient agreeable to plan of care.  Will dose Norco and Zofran orally while awaiting CT results.  Lab work is reassuring with a normal CBC with a normal white blood cell count normal chemistry with normal LFTs and lipase.  Normal urinalysis.  CT scan read as negative.  I did review the CT images myself.  Patient does appear to have significant amount of solid stool in the right colon which could be contributing to the patient's pain especially as it comes intermittently.  Will place the patient on MiraLAX have her follow-up with her doctor.  Patient agreeable to plan of care.  FINAL CLINICAL IMPRESSION(S) / ED DIAGNOSES   Abdominal pain Diarrhea   Note:  This document was prepared  using Dragon voice recognition software and may include unintentional dictation errors.   Ruth Cove, MD 03/16/24 1114

## 2024-03-16 NOTE — ED Notes (Signed)
 Pt to ED for sharp stabbing pain to RUQ since Thursday (6d ago). States bloating to upper abdomen. Takes Zetbound. Has GB. Also having diarrhea since same timeframe. Pt states pain is constant. Denies urinary symptoms but was recently treated for UTI, 2 courses of abx over past 1.5 month.

## 2024-03-16 NOTE — ED Triage Notes (Signed)
 Pt states generalized ABD pain, pt states taking Zepbound. Pt states started a few days ago and located near RUQ. Pt states nausea. Pt denies fevers.

## 2024-03-23 ENCOUNTER — Other Ambulatory Visit: Payer: BC Managed Care – PPO

## 2024-05-23 ENCOUNTER — Encounter: Payer: Self-pay | Admitting: Oncology

## 2024-06-02 ENCOUNTER — Inpatient Hospital Stay

## 2024-06-02 ENCOUNTER — Inpatient Hospital Stay: Attending: Oncology

## 2024-06-02 DIAGNOSIS — E611 Iron deficiency: Secondary | ICD-10-CM | POA: Diagnosis present

## 2024-06-02 DIAGNOSIS — Z79899 Other long term (current) drug therapy: Secondary | ICD-10-CM | POA: Insufficient documentation

## 2024-06-02 DIAGNOSIS — D751 Secondary polycythemia: Secondary | ICD-10-CM

## 2024-06-02 LAB — CBC WITH DIFFERENTIAL/PLATELET
Abs Immature Granulocytes: 0.01 K/uL (ref 0.00–0.07)
Basophils Absolute: 0.1 K/uL (ref 0.0–0.1)
Basophils Relative: 1 %
Eosinophils Absolute: 0.1 K/uL (ref 0.0–0.5)
Eosinophils Relative: 2 %
HCT: 42.4 % (ref 36.0–46.0)
Hemoglobin: 13.9 g/dL (ref 12.0–15.0)
Immature Granulocytes: 0 %
Lymphocytes Relative: 45 %
Lymphs Abs: 2.7 K/uL (ref 0.7–4.0)
MCH: 28.4 pg (ref 26.0–34.0)
MCHC: 32.8 g/dL (ref 30.0–36.0)
MCV: 86.7 fL (ref 80.0–100.0)
Monocytes Absolute: 0.4 K/uL (ref 0.1–1.0)
Monocytes Relative: 7 %
Neutro Abs: 2.8 K/uL (ref 1.7–7.7)
Neutrophils Relative %: 45 %
Platelets: 286 K/uL (ref 150–400)
RBC: 4.89 MIL/uL (ref 3.87–5.11)
RDW: 13.4 % (ref 11.5–15.5)
WBC: 6.1 K/uL (ref 4.0–10.5)
nRBC: 0 % (ref 0.0–0.2)

## 2024-06-02 LAB — IRON AND TIBC
Iron: 89 ug/dL (ref 28–170)
Saturation Ratios: 21 % (ref 10.4–31.8)
TIBC: 424 ug/dL (ref 250–450)
UIBC: 335 ug/dL

## 2024-06-02 LAB — FERRITIN: Ferritin: 10 ng/mL — ABNORMAL LOW (ref 11–307)

## 2024-06-02 NOTE — Progress Notes (Signed)
 No phlebotomy today parameters not met. Hemoglobin below 15 at 13.9 today 06/02/2024

## 2024-06-17 ENCOUNTER — Encounter: Payer: Self-pay | Admitting: Oncology

## 2024-06-17 ENCOUNTER — Telehealth: Payer: Self-pay | Admitting: *Deleted

## 2024-06-17 NOTE — Telephone Encounter (Signed)
 Per the patient she is here in [place] and she is a patient of Dr. Jacobo.  He went to see a doctor in [place] for hormone and menopause.  They took labs today and was told that her ferritin is low and they wanted to see if she can get a IV iron here since this is where she lives. I called her and let her know that Jacobo off work this week , but he will be back next week and sent this info to the finnegan team . Pt. Ok with plan

## 2024-06-20 ENCOUNTER — Other Ambulatory Visit: Payer: Self-pay | Admitting: *Deleted

## 2024-06-20 ENCOUNTER — Other Ambulatory Visit: Payer: Self-pay | Admitting: Oncology

## 2024-06-20 DIAGNOSIS — E611 Iron deficiency: Secondary | ICD-10-CM | POA: Insufficient documentation

## 2024-06-21 ENCOUNTER — Inpatient Hospital Stay: Payer: BC Managed Care – PPO

## 2024-06-23 ENCOUNTER — Inpatient Hospital Stay: Payer: BC Managed Care – PPO | Admitting: Oncology

## 2024-06-23 ENCOUNTER — Ambulatory Visit

## 2024-06-23 ENCOUNTER — Inpatient Hospital Stay

## 2024-06-23 ENCOUNTER — Encounter: Payer: Self-pay | Admitting: Oncology

## 2024-06-23 VITALS — BP 116/61 | HR 65 | Temp 98.3°F | Resp 16

## 2024-06-23 DIAGNOSIS — E611 Iron deficiency: Secondary | ICD-10-CM

## 2024-06-23 MED ORDER — SODIUM CHLORIDE 0.9 % IV SOLN
510.0000 mg | Freq: Once | INTRAVENOUS | Status: AC
  Administered 2024-06-23: 510 mg via INTRAVENOUS
  Filled 2024-06-23: qty 510

## 2024-06-23 MED ORDER — SODIUM CHLORIDE 0.9 % IV SOLN
INTRAVENOUS | Status: DC
  Filled 2024-06-23 (×2): qty 250

## 2024-06-23 NOTE — Patient Instructions (Signed)

## 2024-06-23 NOTE — Progress Notes (Signed)
 [facility]  [phone] [phone]  ID: Cheryl Clark OB: 19-Aug-1966  MR#: [id number]  RDW#:[id number]  Patient Care Team: Eliverto Bette Hover, MD as PCP - General (Family Medicine) Jacobo Evalene PARAS, MD as Consulting Physician (Oncology)  I connected with Cheryl Clark on 06/23/24 at  9:30 AM EDT by video enabled telemedicine visit and verified that I am speaking with the correct person using two identifiers.   I discussed the limitations, risks, security and privacy concerns of performing an evaluation and management service by telemedicine and the availability of in-person appointments. I also discussed with the patient that there may be a patient responsible charge related to this service. The patient expressed understanding and agreed to proceed.   Other persons participating in the visit and their role in the encounter: Patient, MD.  Patient's location: Home. Provider's location: Clinic.   CHIEF COMPLAINT: Polycythemia, heterozygote for hemochromatosis.  INTERVAL HISTORY: Patient agreed to video-assisted telemedicine visit for further evaluation and discussion of IV Venofer.  Recent laboratory work revealed normal hemoglobin, but decreased ferritin level.  Patient reports she is also significantly symptomatic with increased fatigue.  She otherwise feels well.  She has no neurologic complaints.  She denies any recent fevers or illnesses.  She has no chest pain, shortness of breath, cough, or hemoptysis.  She denies any nausea, vomiting, constipation, or diarrhea.  She has no urinary complaints.  Patient offers no further specific complaints today.  REVIEW OF SYSTEMS:   Review of Systems  Constitutional:  Positive for malaise/fatigue. Negative for fever and weight loss.  Respiratory: Negative.  Negative for cough, hemoptysis and shortness of breath.   Cardiovascular: Negative.  Negative for chest pain and leg  swelling.  Gastrointestinal: Negative.  Negative for abdominal pain.  Genitourinary: Negative.  Negative for dysuria.  Musculoskeletal: Negative.  Negative for back pain.  Skin: Negative.  Negative for rash.  Neurological: Negative.  Negative for dizziness, focal weakness, weakness and headaches.  Psychiatric/Behavioral: Negative.  The patient is not nervous/anxious.     As per HPI. Otherwise, a complete review of systems is negative.  PAST MEDICAL HISTORY: Past Medical History:  Diagnosis Date   Rheumatoid arthritis (HCC)    Thyroid disease     PAST SURGICAL HISTORY: Past Surgical History:  Procedure Laterality Date   ABDOMINAL HYSTERECTOMY     FRACTURE SURGERY     Left femur rod   REDUCTION MAMMAPLASTY Bilateral 11/2021   Collagen mesh implant per patient   VAGINAL PROLAPSE REPAIR      FAMILY HISTORY: Family History  Problem Relation Age of Onset   Breast cancer Maternal Grandmother    Breast cancer Paternal Grandmother     ADVANCED DIRECTIVES (Y/N):  N  HEALTH MAINTENANCE: Social History   Tobacco Use   Smoking status: Never   Smokeless tobacco: Never  Vaping Use   Vaping status: Never Used  Substance Use Topics   Alcohol use: No   Drug use: Never     Colonoscopy:  PAP:  Bone density:  Lipid panel:  Allergies  Allergen Reactions   Iodinated Contrast Media Anxiety, Itching, Nausea Only, Palpitations and Shortness Of Breath    Generalized, Nausea   Codeine Itching   Demerol [Meperidine] Itching   Macrobid [Nitrofurantoin Monohyd Macro] Hives   Sulfa Antibiotics Hives    Current Outpatient Medications  Medication Sig Dispense Refill   amphetamine-dextroamphetamine (ADDERALL) 10 MG tablet Take by mouth.     diflorasone (PSORCON) 0.05 % cream SMARTSIG:2  Topical Twice Daily     docusate sodium  (COLACE) 100 MG capsule Take 1 capsule (100 mg total) by mouth 2 (two) times daily. 60 capsule 5   estradiol  (ESTRACE ) 0.1 MG/GM vaginal cream Place 1  Applicatorful vaginally 3 (three) times a week. Apply dime size to urethra 42.5 g 12   fluconazole  (DIFLUCAN ) 150 MG tablet Take 1 tablet (150 mg total) by mouth daily. 1 tablet 1   hydrOXYzine (ATARAX) 25 MG tablet Take 1 tablet by mouth 3 (three) times daily as needed.     Lancets (ONETOUCH DELICA PLUS LANCET33G) MISC      levothyroxine  (SYNTHROID ) 25 MCG tablet Take 1 tablet (25 mcg total) by mouth daily before breakfast. 30 tablet 6   Multiple Vitamin (MULTI-VITAMIN) tablet Take 1 tablet by mouth daily.     omeprazole (PRILOSEC) 40 MG capsule Take 40 mg by mouth daily.     ondansetron  (ZOFRAN -ODT) 4 MG disintegrating tablet Take 4 mg by mouth every 8 (eight) hours as needed.     polyethylene glycol powder (GLYCOLAX /MIRALAX ) 17 GM/SCOOP powder Take 17 g by mouth 2 (two) times daily as needed for mild constipation. 255 g 0   progesterone (PROMETRIUM) 200 MG capsule Take 200 mg by mouth at bedtime.     ZEPBOUND 7.5 MG/0.5ML Pen SMARTSIG:0.5 Milliliter(s) SUB-Q Once a Week     zolpidem  (AMBIEN  CR) 6.25 MG CR tablet Take 1 tablet (6.25 mg total) by mouth at bedtime as needed for sleep. 30 tablet 3   buPROPion (WELLBUTRIN XL) 150 MG 24 hr tablet Take by mouth. (Patient not taking: Reported on 06/23/2024)     ZEPBOUND 10 MG/0.5ML Pen Inject into the skin. (Patient not taking: Reported on 06/23/2024)     No current facility-administered medications for this visit.    OBJECTIVE: There were no vitals filed for this visit.     There is no height or weight on file to calculate BMI.    ECOG FS:0 - Asymptomatic  General: Well-developed, well-nourished, no acute distress. HEENT: Normocephalic. Neuro: Alert, answering all questions appropriately. Cranial nerves grossly intact. Psych: Normal affect.  LAB RESULTS:  Lab Results  Component Value Date   NA 141 03/16/2024   K 4.0 03/16/2024   CL 110 03/16/2024   CO2 25 03/16/2024   GLUCOSE 100 (H) 03/16/2024   BUN 11 03/16/2024   CREATININE 0.86  03/16/2024   CALCIUM 9.5 03/16/2024   PROT 6.5 03/16/2024   ALBUMIN 3.8 03/16/2024   AST 23 03/16/2024   ALT 18 03/16/2024   ALKPHOS 55 03/16/2024   BILITOT 0.9 03/16/2024   GFRNONAA >60 03/16/2024   GFRAA >60 06/09/2015    Lab Results  Component Value Date   WBC 6.1 06/02/2024   NEUTROABS 2.8 06/02/2024   HGB 13.9 06/02/2024   HCT 42.4 06/02/2024   MCV 86.7 06/02/2024   PLT 286 06/02/2024   Lab Results  Component Value Date   IRON 89 06/02/2024   TIBC 424 06/02/2024   IRONPCTSAT 21 06/02/2024   Lab Results  Component Value Date   FERRITIN 10 (L) 06/02/2024     STUDIES: No results found.  ASSESSMENT: Polycythemia, heterozygote for hemochromatosis.  PLAN:    Iron deficiency: Patient has a decreased ferritin level, but the remainder of her iron panel and hemoglobin are within normal limits.  She reports significant fatigue.  Despite her history of polycythemia and requiring phlebotomy, she will return to clinic later today to receive 200 mg IV Venofer.  She will then  return to clinic 1 time next week for a second dose.  Return to clinic in 3 months with repeat laboratory, further evaluation, and continuation of treatment if needed. Polycythemia: Resolved.  Previously, all of her other laboratory work including iron panel, erythropoietin  levels, JAK2 mutation with reflex are all negative or within normal limits.  IV Venofer as above.  Patient does not require additional phlebotomy. 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  I provided 30 minutes of face-to-face video visit time during this encounter which included chart review, counseling, and coordination of care as documented above.   Patient expressed understanding and was in agreement with this plan. She also understands that She can call clinic at any time with any questions, concerns, or complaints.     Evalene JINNY Reusing, MD   06/23/2024 9:37 AM

## 2024-06-23 NOTE — Progress Notes (Signed)
 She is wanting to discuss her getting her first iron infusion this afternoon. She wants to know how many of the iron infusions she would have to get to be at the normal level?

## 2024-06-30 ENCOUNTER — Inpatient Hospital Stay: Attending: Oncology

## 2024-08-01 ENCOUNTER — Encounter: Payer: Self-pay | Admitting: Oncology

## 2024-08-03 ENCOUNTER — Other Ambulatory Visit: Payer: Self-pay

## 2024-08-03 ENCOUNTER — Encounter: Payer: Self-pay | Admitting: Certified Nurse Midwife

## 2024-08-03 NOTE — Telephone Encounter (Signed)
 Patient contacted office to request refill on Ambien , patients last office visit was 01/20/24 for menopausal symptoms. KW

## 2024-08-04 MED ORDER — ZOLPIDEM TARTRATE ER 6.25 MG PO TBCR: 6.2500 mg | EXTENDED_RELEASE_TABLET | Freq: Every evening | ORAL | 3 refills | Status: AC | PRN

## 2024-08-08 ENCOUNTER — Ambulatory Visit: Admitting: Obstetrics and Gynecology

## 2024-08-26 ENCOUNTER — Encounter: Payer: Self-pay | Admitting: Oncology

## 2024-09-01 ENCOUNTER — Ambulatory Visit: Admitting: Obstetrics and Gynecology

## 2024-09-08 ENCOUNTER — Ambulatory Visit: Admitting: Certified Nurse Midwife

## 2024-09-19 ENCOUNTER — Ambulatory Visit

## 2024-09-19 ENCOUNTER — Other Ambulatory Visit

## 2024-09-19 ENCOUNTER — Ambulatory Visit: Admitting: Oncology

## 2024-09-27 ENCOUNTER — Other Ambulatory Visit

## 2024-09-27 ENCOUNTER — Ambulatory Visit

## 2024-09-27 ENCOUNTER — Ambulatory Visit: Admitting: Oncology

## 2024-11-07 ENCOUNTER — Inpatient Hospital Stay: Admitting: Oncology

## 2024-11-07 ENCOUNTER — Inpatient Hospital Stay

## 2024-11-14 ENCOUNTER — Inpatient Hospital Stay

## 2024-11-14 ENCOUNTER — Encounter: Payer: Self-pay | Admitting: Oncology

## 2024-11-14 ENCOUNTER — Inpatient Hospital Stay: Attending: Oncology

## 2024-11-14 ENCOUNTER — Inpatient Hospital Stay: Admitting: Oncology

## 2024-11-14 VITALS — BP 108/72 | HR 85 | Temp 98.2°F | Resp 20 | Wt 199.5 lb

## 2024-11-14 DIAGNOSIS — E611 Iron deficiency: Secondary | ICD-10-CM | POA: Diagnosis not present

## 2024-11-14 LAB — CBC WITH DIFFERENTIAL/PLATELET
Abs Immature Granulocytes: 0.04 K/uL (ref 0.00–0.07)
Basophils Absolute: 0.1 K/uL (ref 0.0–0.1)
Basophils Relative: 1 %
Eosinophils Absolute: 0.1 K/uL (ref 0.0–0.5)
Eosinophils Relative: 1 %
HCT: 47.5 % — ABNORMAL HIGH (ref 36.0–46.0)
Hemoglobin: 16.1 g/dL — ABNORMAL HIGH (ref 12.0–15.0)
Immature Granulocytes: 1 %
Lymphocytes Relative: 39 %
Lymphs Abs: 3.3 K/uL (ref 0.7–4.0)
MCH: 30.6 pg (ref 26.0–34.0)
MCHC: 33.9 g/dL (ref 30.0–36.0)
MCV: 90.3 fL (ref 80.0–100.0)
Monocytes Absolute: 0.6 K/uL (ref 0.1–1.0)
Monocytes Relative: 7 %
Neutro Abs: 4.5 K/uL (ref 1.7–7.7)
Neutrophils Relative %: 51 %
Platelets: 282 K/uL (ref 150–400)
RBC: 5.26 MIL/uL — ABNORMAL HIGH (ref 3.87–5.11)
RDW: 12.3 % (ref 11.5–15.5)
WBC: 8.6 K/uL (ref 4.0–10.5)
nRBC: 0 % (ref 0.0–0.2)

## 2024-11-14 LAB — FERRITIN: Ferritin: 68 ng/mL (ref 11–307)

## 2024-11-14 LAB — IRON AND TIBC
Iron: 73 ug/dL (ref 28–170)
Saturation Ratios: 21 % (ref 10.4–31.8)
TIBC: 353 ug/dL (ref 250–450)
UIBC: 280 ug/dL

## 2024-11-14 NOTE — Progress Notes (Unsigned)
 Patient states she has nose bleeds for the last month. (Maybe 3 a week)

## 2024-11-14 NOTE — Progress Notes (Unsigned)
 " [facility]  Telephone:([phone] [phone]  ID: Cheryl Clark OB: [birth year]  MR#: [id number]  RDW#:[id number]  Patient Care Team: Eliverto Bette Hover, MD as PCP - General (Family Medicine) Jacobo Evalene PARAS, MD as Consulting Physician (Oncology)  CHIEF COMPLAINT: Polycythemia, iron deficiency.  INTERVAL HISTORY: Patient returns to clinic today for repeat laboratory work, further evaluation, and consideration of additional IV Venofer.  She felt significantly improved after receiving IV Venofer several months ago, but more recently has felt increased fatigue.  She otherwise feels well.  She has no neurologic complaints.  She denies any recent fevers or illnesses.  She has no chest pain, shortness of breath, cough, or hemoptysis.  She denies any nausea, vomiting, constipation, or diarrhea.  She has no melena or hematochezia.  She has no urinary complaints.  Patient offers no further specific complaints today.  REVIEW OF SYSTEMS:   Review of Systems  Constitutional:  Positive for malaise/fatigue. Negative for fever and weight loss.  Respiratory: Negative.  Negative for cough, hemoptysis and shortness of breath.   Cardiovascular: Negative.  Negative for chest pain and leg swelling.  Gastrointestinal: Negative.  Negative for abdominal pain.  Genitourinary: Negative.  Negative for dysuria.  Musculoskeletal: Negative.  Negative for back pain.  Skin: Negative.  Negative for rash.  Neurological: Negative.  Negative for dizziness, focal weakness, weakness and headaches.  Psychiatric/Behavioral: Negative.  The patient is not nervous/anxious.     As per HPI. Otherwise, a complete review of systems is negative.  PAST MEDICAL HISTORY: Past Medical History:  Diagnosis Date   Rheumatoid arthritis (HCC)    Thyroid disease     PAST SURGICAL HISTORY: Past Surgical History:  Procedure Laterality Date   ABDOMINAL HYSTERECTOMY     FRACTURE SURGERY      Left femur rod   REDUCTION MAMMAPLASTY Bilateral 11/2021   Collagen mesh implant per patient   VAGINAL PROLAPSE REPAIR      FAMILY HISTORY: Family History  Problem Relation Age of Onset   Breast cancer Maternal Grandmother    Breast cancer Paternal Grandmother     ADVANCED DIRECTIVES (Y/N):  N  HEALTH MAINTENANCE: Social History   Tobacco Use   Smoking status: Never   Smokeless tobacco: Never  Vaping Use   Vaping status: Never Used  Substance Use Topics   Alcohol use: No   Drug use: Never     Colonoscopy:  PAP:  Bone density:  Lipid panel:  Allergies  Allergen Reactions   Iodinated Contrast Media Anxiety, Itching, Nausea Only, Palpitations and Shortness Of Breath    Generalized, Nausea   Amoxicillin -Pot Clavulanate Nausea Only   Codeine Itching   Demerol [Meperidine] Itching   Macrobid [Nitrofurantoin Monohyd Macro] Hives   Prednisone Other (See Comments)    Produces insomnia   Sulfa Antibiotics Hives    Current Outpatient Medications  Medication Sig Dispense Refill   amphetamine-dextroamphetamine (ADDERALL) 10 MG tablet Take by mouth.     diflorasone (PSORCON) 0.05 % cream SMARTSIG:2 Topical Twice Daily     fluconazole  (DIFLUCAN ) 150 MG tablet Take 1 tablet (150 mg total) by mouth daily. 1 tablet 1   Lancets (ONETOUCH DELICA PLUS LANCET33G) MISC      levothyroxine  (SYNTHROID ) 25 MCG tablet Take 1 tablet (25 mcg total) by mouth daily before breakfast. 30 tablet 6   Multiple Vitamin (MULTI-VITAMIN) tablet Take 1 tablet by mouth daily.     omeprazole (PRILOSEC) 40 MG capsule Take 40 mg by mouth daily.  ondansetron  (ZOFRAN -ODT) 4 MG disintegrating tablet Take 4 mg by mouth every 8 (eight) hours as needed.     progesterone (PROMETRIUM) 200 MG capsule Take 200 mg by mouth at bedtime.     ZEPBOUND 7.5 MG/0.5ML Pen SMARTSIG:0.5 Milliliter(s) SUB-Q Once a Week     zolpidem  (AMBIEN  CR) 6.25 MG CR tablet Take 1 tablet (6.25 mg total) by mouth at bedtime as  needed for sleep. 30 tablet 3   buPROPion (WELLBUTRIN XL) 150 MG 24 hr tablet Take by mouth. (Patient not taking: Reported on 11/14/2024)     docusate sodium  (COLACE) 100 MG capsule Take 1 capsule (100 mg total) by mouth 2 (two) times daily. (Patient not taking: Reported on 11/14/2024) 60 capsule 5   estradiol  (ESTRACE ) 0.1 MG/GM vaginal cream Place 1 Applicatorful vaginally 3 (three) times a week. Apply dime size to urethra (Patient not taking: Reported on 11/14/2024) 42.5 g 12   hydrOXYzine (ATARAX) 25 MG tablet Take 1 tablet by mouth 3 (three) times daily as needed. (Patient not taking: Reported on 11/14/2024)     polyethylene glycol powder (GLYCOLAX /MIRALAX ) 17 GM/SCOOP powder Take 17 g by mouth 2 (two) times daily as needed for mild constipation. (Patient not taking: Reported on 11/14/2024) 255 g 0   ZEPBOUND 10 MG/0.5ML Pen Inject into the skin. (Patient not taking: Reported on 11/14/2024)     No current facility-administered medications for this visit.    OBJECTIVE: Vitals:   11/14/24 1445  BP: 108/72  Pulse: 85  Resp: 20  Temp: 98.2 F (36.8 C)  SpO2: 100%       Body mass index is 29.46 kg/m.    ECOG FS:0 - Asymptomatic  General: Well-developed, well-nourished, no acute distress. Eyes: Pink conjunctiva, anicteric sclera. HEENT: Normocephalic, moist mucous membranes. Lungs: No audible wheezing or coughing. Heart: Regular rate and rhythm. Abdomen: Soft, nontender, no obvious distention. Musculoskeletal: No edema, cyanosis, or clubbing. Neuro: Alert, answering all questions appropriately. Cranial nerves grossly intact. Skin: No rashes or petechiae noted. Psych: Normal affect.  LAB RESULTS:  Lab Results  Component Value Date   NA 141 03/16/2024   K 4.0 03/16/2024   CL 110 03/16/2024   CO2 25 03/16/2024   GLUCOSE 100 (H) 03/16/2024   BUN 11 03/16/2024   CREATININE 0.86 03/16/2024   CALCIUM 9.5 03/16/2024   PROT 6.5 03/16/2024   ALBUMIN 3.8 03/16/2024   AST 23 03/16/2024    ALT 18 03/16/2024   ALKPHOS 55 03/16/2024   BILITOT 0.9 03/16/2024   GFRNONAA >60 03/16/2024   GFRAA >60 06/09/2015    Lab Results  Component Value Date   WBC 8.6 11/14/2024   NEUTROABS 4.5 11/14/2024   HGB 16.1 (H) 11/14/2024   HCT 47.5 (H) 11/14/2024   MCV 90.3 11/14/2024   PLT 282 11/14/2024   Lab Results  Component Value Date   IRON 73 11/14/2024   TIBC 353 11/14/2024   IRONPCTSAT 21 11/14/2024   Lab Results  Component Value Date   FERRITIN 68 11/14/2024     STUDIES: No results found.  ASSESSMENT: Polycythemia, iron deficiency.  PLAN:    Iron deficiency: Resolved.  Patient's hemoglobin is mildly elevated at 16.1, and her iron stores are now within normal limit.  She received 1 infusion of 510 mg Feraheme on June 23, 2024.  She does not require additional treatment at this time.  Return to clinic in 3 months with repeat laboratory work and evaluation by APP. Polycythemia: Mild.  Patient's hemoglobin is 16.1 today.  Previously, all of her other laboratory work including iron panel, erythropoietin  levels, JAK2 mutation with reflex are all negative or within normal limits. Patient does not require additional phlebotomy. 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  I spent a total of 20 minutes reviewing chart data, face-to-face evaluation with the patient, counseling and coordination of care as detailed above.    Patient expressed understanding and was in agreement with this plan. She also understands that She can call clinic at any time with any questions, concerns, or complaints.     Evalene JINNY Reusing, MD   11/15/2024 7:15 AM     "

## 2024-11-15 ENCOUNTER — Encounter: Payer: Self-pay | Admitting: Oncology

## 2024-11-17 ENCOUNTER — Other Ambulatory Visit: Payer: Self-pay | Admitting: Certified Nurse Midwife

## 2025-02-13 ENCOUNTER — Inpatient Hospital Stay

## 2025-02-14 ENCOUNTER — Inpatient Hospital Stay: Admitting: Nurse Practitioner

## 2025-02-14 ENCOUNTER — Inpatient Hospital Stay
